# Patient Record
Sex: Female | Born: 1965 | Hispanic: No | Marital: Married | Smoking: Never smoker
Health system: Southern US, Community
[De-identification: ages and names within clinical notes are randomized; demographics above are authoritative.]

## PROBLEM LIST (undated history)

## (undated) DIAGNOSIS — I493 Ventricular premature depolarization: Secondary | ICD-10-CM

## (undated) DIAGNOSIS — Z9889 Other specified postprocedural states: Secondary | ICD-10-CM

## (undated) DIAGNOSIS — J45909 Unspecified asthma, uncomplicated: Secondary | ICD-10-CM

## (undated) DIAGNOSIS — R51 Headache: Secondary | ICD-10-CM

## (undated) DIAGNOSIS — R519 Headache, unspecified: Secondary | ICD-10-CM

## (undated) DIAGNOSIS — F419 Anxiety disorder, unspecified: Secondary | ICD-10-CM

## (undated) DIAGNOSIS — R112 Nausea with vomiting, unspecified: Secondary | ICD-10-CM

## (undated) HISTORY — PX: ABDOMINAL HYSTERECTOMY: SHX81

---

## 2015-12-27 DIAGNOSIS — F418 Other specified anxiety disorders: Secondary | ICD-10-CM | POA: Insufficient documentation

## 2015-12-27 DIAGNOSIS — J45909 Unspecified asthma, uncomplicated: Secondary | ICD-10-CM | POA: Insufficient documentation

## 2016-04-25 ENCOUNTER — Other Ambulatory Visit: Payer: Self-pay | Admitting: Internal Medicine

## 2016-04-25 DIAGNOSIS — Z1231 Encounter for screening mammogram for malignant neoplasm of breast: Secondary | ICD-10-CM

## 2016-05-01 ENCOUNTER — Ambulatory Visit
Admission: RE | Admit: 2016-05-01 | Discharge: 2016-05-01 | Disposition: A | Discharge status: Home / Self Care | Payer: BLUE CROSS/BLUE SHIELD | Source: Ambulatory Visit | Attending: Pediatrics | Admitting: Pediatrics

## 2016-05-01 ENCOUNTER — Other Ambulatory Visit: Payer: Self-pay | Admitting: Pediatrics

## 2016-05-01 DIAGNOSIS — Z1231 Encounter for screening mammogram for malignant neoplasm of breast: Secondary | ICD-10-CM | POA: Diagnosis present

## 2016-05-13 ENCOUNTER — Other Ambulatory Visit: Payer: Self-pay | Admitting: *Deleted

## 2016-05-13 ENCOUNTER — Inpatient Hospital Stay
Admission: RE | Admit: 2016-05-13 | Discharge: 2016-05-13 | Disposition: A | Discharge status: Home / Self Care | Payer: Self-pay | Source: Ambulatory Visit | Attending: *Deleted | Admitting: *Deleted

## 2016-05-13 DIAGNOSIS — Z9289 Personal history of other medical treatment: Secondary | ICD-10-CM

## 2016-09-27 DIAGNOSIS — J302 Other seasonal allergic rhinitis: Secondary | ICD-10-CM | POA: Insufficient documentation

## 2017-04-02 ENCOUNTER — Other Ambulatory Visit: Payer: Self-pay | Admitting: Obstetrics and Gynecology

## 2017-04-02 DIAGNOSIS — N6489 Other specified disorders of breast: Secondary | ICD-10-CM

## 2017-04-10 ENCOUNTER — Ambulatory Visit
Admission: RE | Admit: 2017-04-10 | Discharge: 2017-04-10 | Disposition: A | Discharge status: Home / Self Care | Payer: BLUE CROSS/BLUE SHIELD | Source: Ambulatory Visit | Attending: Obstetrics and Gynecology | Admitting: Obstetrics and Gynecology

## 2017-04-10 DIAGNOSIS — N6489 Other specified disorders of breast: Secondary | ICD-10-CM

## 2017-05-02 ENCOUNTER — Ambulatory Visit (INDEPENDENT_AMBULATORY_CARE_PROVIDER_SITE_OTHER)
Admission: EM | Admit: 2017-05-02 | Discharge: 2017-05-02 | Disposition: A | Discharge status: Other Acute Inpatient Hospital | Payer: BLUE CROSS/BLUE SHIELD | Source: Home / Self Care | Attending: Family Medicine | Admitting: Family Medicine

## 2017-05-02 ENCOUNTER — Emergency Department: Payer: BLUE CROSS/BLUE SHIELD

## 2017-05-02 ENCOUNTER — Emergency Department
Admission: EM | Admit: 2017-05-02 | Discharge: 2017-05-02 | Disposition: A | Discharge status: Home / Self Care | Payer: BLUE CROSS/BLUE SHIELD | Attending: Emergency Medicine | Admitting: Emergency Medicine

## 2017-05-02 ENCOUNTER — Encounter: Payer: Self-pay | Admitting: Emergency Medicine

## 2017-05-02 DIAGNOSIS — I517 Cardiomegaly: Secondary | ICD-10-CM | POA: Insufficient documentation

## 2017-05-02 DIAGNOSIS — Z885 Allergy status to narcotic agent status: Secondary | ICD-10-CM | POA: Insufficient documentation

## 2017-05-02 DIAGNOSIS — Z8249 Family history of ischemic heart disease and other diseases of the circulatory system: Secondary | ICD-10-CM

## 2017-05-02 DIAGNOSIS — F419 Anxiety disorder, unspecified: Secondary | ICD-10-CM | POA: Insufficient documentation

## 2017-05-02 DIAGNOSIS — Z803 Family history of malignant neoplasm of breast: Secondary | ICD-10-CM | POA: Insufficient documentation

## 2017-05-02 DIAGNOSIS — Z79899 Other long term (current) drug therapy: Secondary | ICD-10-CM

## 2017-05-02 DIAGNOSIS — Z823 Family history of stroke: Secondary | ICD-10-CM | POA: Insufficient documentation

## 2017-05-02 DIAGNOSIS — Z87891 Personal history of nicotine dependence: Secondary | ICD-10-CM | POA: Insufficient documentation

## 2017-05-02 DIAGNOSIS — Z888 Allergy status to other drugs, medicaments and biological substances status: Secondary | ICD-10-CM | POA: Insufficient documentation

## 2017-05-02 DIAGNOSIS — R9431 Abnormal electrocardiogram [ECG] [EKG]: Secondary | ICD-10-CM | POA: Insufficient documentation

## 2017-05-02 DIAGNOSIS — R079 Chest pain, unspecified: Secondary | ICD-10-CM

## 2017-05-02 DIAGNOSIS — Z880 Allergy status to penicillin: Secondary | ICD-10-CM

## 2017-05-02 DIAGNOSIS — R002 Palpitations: Secondary | ICD-10-CM | POA: Insufficient documentation

## 2017-05-02 DIAGNOSIS — R200 Anesthesia of skin: Secondary | ICD-10-CM | POA: Insufficient documentation

## 2017-05-02 LAB — CBC
HEMATOCRIT: 41.2 % (ref 35.0–47.0)
HEMOGLOBIN: 14.2 g/dL (ref 12.0–16.0)
MCH: 31.7 pg (ref 26.0–34.0)
MCHC: 34.4 g/dL (ref 32.0–36.0)
MCV: 92.3 fL (ref 80.0–100.0)
Platelets: 203 10*3/uL (ref 150–440)
RBC: 4.47 MIL/uL (ref 3.80–5.20)
RDW: 13.7 % (ref 11.5–14.5)
WBC: 5.5 10*3/uL (ref 3.6–11.0)

## 2017-05-02 LAB — BASIC METABOLIC PANEL
ANION GAP: 9 (ref 5–15)
BUN: 20 mg/dL (ref 6–20)
CO2: 28 mmol/L (ref 22–32)
Calcium: 9.2 mg/dL (ref 8.9–10.3)
Chloride: 104 mmol/L (ref 101–111)
Creatinine, Ser: 0.77 mg/dL (ref 0.44–1.00)
GFR calc Af Amer: >60 mL/min (ref >60)
GFR calc non Af Amer: >60 mL/min (ref >60)
GLUCOSE: 119 mg/dL — AB (ref 65–99)
POTASSIUM: 3.7 mmol/L (ref 3.5–5.1)
Sodium: 141 mmol/L (ref 135–145)

## 2017-05-02 LAB — TROPONIN I: Troponin I: <0.03 ng/mL (ref <0.03)

## 2017-05-02 MED ORDER — ASPIRIN 81 MG PO CHEW
324.0000 mg | CHEWABLE_TABLET | Freq: Once | ORAL | Status: AC
  Administered 2017-05-02: 324 mg via ORAL

## 2017-05-02 MED ORDER — DIAZEPAM 5 MG PO TABS: 5.0000 mg | ORAL_TABLET | Freq: Three times a day (TID) | ORAL | 0 refills | Status: DC | PRN

## 2017-05-02 MED ORDER — DIAZEPAM 5 MG PO TABS
5.0000 mg | ORAL_TABLET | Freq: Once | ORAL | Status: AC
  Administered 2017-05-02: 5 mg via ORAL
  Filled 2017-05-02: qty 1

## 2017-05-02 MED ORDER — ASPIRIN 81 MG PO CHEW: 324.0000 mg | CHEWABLE_TABLET | Freq: Once | ORAL | Status: DC

## 2017-05-02 NOTE — ED Provider Notes (Signed)
[facility] Emergency Department Provider Note       Time seen: ----------------------------------------- 4:27 PM on 05/02/2017 -----------------------------------------     I have reviewed the triage vital signs and the nursing notes.   HISTORY   Chief Complaint Chest Pain    HPI Amber Weaver is a 51 y.o. female who presents to the ED for irregular heartbeat and chest pain. Patient states she has 4 out of 10 chest tightness, nothing makes it better or worse. She denies any recent illness. Patient has had significant stress events in the past but this feels differently.   History reviewed. No pertinent past medical history.  There are no active problems to display for this patient.   History reviewed. No pertinent surgical history.  Allergies Morphine and related; Penicillins; and Vicodin [hydrocodone-acetaminophen]  Social History Social History  Substance Use Topics  . Smoking status: Never Smoker  . Smokeless tobacco: Never Used  . Alcohol use Yes    Review of Systems Constitutional: Negative for fever. Eyes: Negative for vision changes ENT:  Negative for congestion, sore throat Cardiovascular: positive for chest pain, palpitations Respiratory: Negative for shortness of breath. Gastrointestinal: Negative for abdominal pain, vomiting and diarrhea. Genitourinary: Negative for dysuria. Musculoskeletal: Negative for back pain. Skin: Negative for rash. Neurological: Negative for headaches, focal weakness or numbness.  All systems negative/normal/unremarkable except as stated in the HPI  ____________________________________________   PHYSICAL EXAM:  VITAL SIGNS: ED Triage Vitals  Enc Vitals Group     BP 05/02/17 1443 138/80     Pulse Rate 05/02/17 1443 93     Resp 05/02/17 1443 16     Temp --      Temp src --      SpO2 05/02/17 1443 98 %     Weight 05/02/17 1443 150 lb (68 kg)     Height 05/02/17 1443 5\' 6"  (1.676 m)   Head Circumference --      Peak Flow --      Pain Score 05/02/17 1625 4     Pain Loc --      Pain Edu? --      Excl. in [place]? --     Constitutional: anxious, no distress Eyes: Conjunctivae are normal. Normal extraocular movements. ENT   Head: Normocephalic and atraumatic.   Nose: No congestion/rhinnorhea.   Mouth/Throat: Mucous membranes are moist.   Neck: No stridor. Cardiovascular: Normal rate, regular rhythm. No murmurs, rubs, or gallops. Respiratory: Normal respiratory effort without tachypnea nor retractions. Breath sounds are clear and equal bilaterally. No wheezes/rales/rhonchi. Gastrointestinal: Soft and nontender. Normal bowel sounds Musculoskeletal: Nontender with normal range of motion in extremities. No lower extremity tenderness nor edema. Neurologic:  Normal speech and language. No gross focal neurologic deficits are appreciated.  Skin:  Skin is warm, dry and intact. No rash noted. Psychiatric: Mood and affect are normal. Speech and behavior are normal.  ____________________________________________  EKG: Interpreted by me.sinus rhythm with occasional PVCs, normal PR interval, normal QRS, normal QT.  ____________________________________________  ED COURSE:  Pertinent labs & imaging results that were available during my care of the patient were reviewed by me and considered in my medical decision making (see chart for details). Patient presents for chest pain, we will assess with labs and imaging as indicated.   Procedures ____________________________________________   LABS (pertinent positives/negatives)  Labs Reviewed  BASIC METABOLIC PANEL - Abnormal; Notable for the following:       Result Value   Glucose, Bld 119 (*)  All other components within normal limits  CBC  TROPONIN I    RADIOLOGY chest x-ray  IMPRESSION: Hyperinflation without evidence of active disease.   ____________________________________________  FINAL ASSESSMENT AND  PLAN  chest pain, palpitations, anxiety   Plan: Patient's labs and imaging were dictated above. Patient had presented for chest pain likely associated with anxiety and palpitations. Her workup here is been negative. She did have occasional PVCs on her EKG but no other concerning findings. She is stable for outpatient follow-up.   Earleen Newport, MD   Note: This note was generated in part or whole with voice recognition software. Voice recognition is usually quite accurate but there are transcription errors that can and very often do occur. I apologize for any typographical errors that were not detected and corrected.     Earleen Newport, MD 05/02/17 [phone]

## 2017-05-02 NOTE — ED Notes (Signed)
Pt sent over from [facility] for further eval of chest pain. Pt had abnormal ECG at [place]. Pt was advised to be transported by EMS but she refused and stated that "the ambulance was too slow". This per Dr Alveta Heimlich over at [facility].

## 2017-05-02 NOTE — ED Provider Notes (Signed)
[facility]    CSN: [id number] Arrival date & time: 05/02/17  1323     History   Chief Complaint Chief Complaint  Patient presents with  . Chest Pain    HPI Amber Weaver is a 51 y.o. female.   Patient is a 51 year old white female who presents with chest tightness. She states she woke up about 8:00 this morning with chest tightness. She reports chest tightness radiating was also characterized by tingling down both shoulders and numbness down both shoulders. As the discomfort continued during the day she took some Gaviscon without relief. Later on his day went on in the chest tightness and discomfort continued she took some Aleve but one didn't improve and in our she came here to be seen and evaluated. She states she's had a history of anxiety before she is also was entering menopause and she admits those things can be a factor in her distress. However she states that she also listened to her heart with a stethoscope and can hear palpitations and arrhythmias occurring and so she became even more anxious and nervous. Past smoker history does not other medical problems or medical history this pertinent. Her father had multiple strokes after he was 55 died of liver cancer and mother died of consultations and dementia. No early history the family of sudden death. She does not smoke. No previous surgeries operations no chronic medications.   The history is provided by the patient. No language interpreter was used.  Chest Pain  Pain location:  Substernal area and L chest Pain quality: aching and pressure   Pain radiates to:  L shoulder and R shoulder Pain severity:  Moderate Onset quality:  Sudden Duration:  6 hours Timing:  Constant Progression:  Worsening Chronicity:  New Context: at rest   Relieved by:  Nothing Worsened by:  Nothing Ineffective treatments:  Antacids (antiindflamatories) Associated symptoms: dizziness and palpitations   Associated symptoms: no shortness  of breath   Risk factors: no high cholesterol, no hypertension, not female, not pregnant, no prior DVT/PE and no smoking     History reviewed. No pertinent past medical history.  There are no active problems to display for this patient.   History reviewed. No pertinent surgical history.  OB History    No data available       Home Medications    Prior to Admission medications   Medication Sig Start Date End Date Taking? Authorizing Provider  magnesium oxide (MAG-OX) 400 MG tablet Take 400 mg by mouth daily.   Yes [provider]    Family History Family History  Problem Relation Age of Onset  . Hypertension Mother   . Stroke Father   . Breast cancer Neg Hx     Social History Social History  Substance Use Topics  . Smoking status: Never Smoker  . Smokeless tobacco: Never Used  . Alcohol use Yes     Allergies   Morphine and related; Penicillins; and Vicodin [hydrocodone-acetaminophen]   Review of Systems Review of Systems  Respiratory: Negative for shortness of breath.   Cardiovascular: Positive for chest pain and palpitations.  Neurological: Positive for dizziness.  All other systems reviewed and are negative.    Physical Exam Triage Vital Signs ED Triage Vitals  Enc Vitals Group     BP 05/02/17 1328 (!) 140/96     Pulse Rate 05/02/17 1328 93     Resp 05/02/17 1328 14     Temp 05/02/17 1328 98.7 F (37.1 C)  Temp Source 05/02/17 1328 Oral     SpO2 05/02/17 1328 99 %     Weight 05/02/17 1326 150 lb (68 kg)     Height 05/02/17 1326 5\' 6"  (1.676 m)     Head Circumference --      Peak Flow --      Pain Score 05/02/17 1326 4     Pain Loc --      Pain Edu? --      Excl. in [place]? --    No data found.   Updated Vital Signs BP (!) 140/96 (BP Location: Left Arm)   Pulse 93   Temp 98.7 F (37.1 C) (Oral)   Resp 14   Ht 5\' 6"  (1.676 m)   Wt 150 lb (68 kg)   SpO2 99%   BMI 24.21 kg/m   Visual Acuity Right Eye Distance:   Left Eye  Distance:   Bilateral Distance:    Right Eye Near:   Left Eye Near:    Bilateral Near:     Physical Exam  Constitutional: She is oriented to person, place, and time. She appears well-developed and well-nourished. She appears distressed.  HENT:  Head: Normocephalic and atraumatic.  Right Ear: External ear normal.  Left Ear: External ear normal.  Eyes: Pupils are equal, round, and reactive to light. EOM are normal.  Neck: Normal range of motion. Neck supple.  Cardiovascular: S1 normal, S2 normal, normal heart sounds and normal pulses.  An irregular rhythm present.  Pulmonary/Chest: Effort normal and breath sounds normal. No respiratory distress.  Abdominal: Soft. Bowel sounds are normal. She exhibits no distension.  Musculoskeletal: Normal range of motion. She exhibits no edema or tenderness.  Neurological: She is alert and oriented to person, place, and time.  Skin: Skin is warm.  Psychiatric: She has a normal mood and affect. Her behavior is normal.  Vitals reviewed.    UC Treatments / Results  Labs (all labs ordered are listed, but only abnormal results are displayed) Labs Reviewed - No data to display  EKG  EKG Interpretation None      ED ECG REPORT I, Keyira Mondesir H, the attending physician, personally viewed and interpreted this ECG.   Date: 05/02/2017  EKG Time:13:33:23  Rate:92  Rhythm: there are no previous tracings available for comparison, normal sinus rhythm, frequent PVC's noted  Axis: 70  Intervals:none  ST&T Change: Nonspecific ST changes Radiology No results found.  Procedures Procedures (including critical care time)  Medications Ordered in UC Medications  aspirin chewable tablet 324 mg (324 mg Oral Given 05/02/17 1354)     Initial Impression / Assessment and Plan / UC Course  I have reviewed the triage vital signs and the nursing notes.  Pertinent labs & imaging results that were available during my care of the patient were reviewed by me  and considered in my medical decision making (see chart for details).    EKG was abnormal with PVCs and this some nonspecific ST changes present as well. PVCs were frequent. Because of her abnormal EKG age recommend they go to the ED of her choice they have elected to go to [facility] regional ED. Initially with her husband present in the room they've elected to go by and last to [facility] ED but after 4 baby aspirins were ordered and given as well as O2 they have not decided they want to go by POV and we acquiesced to their request.   Final Clinical Impressions(s) / UC Diagnoses   Final  diagnoses:  Chest pain, unspecified type  Anxiety  Palpitations    New Prescriptions Discharge Medication List as of 05/02/2017  2:03 PM     Note: This dictation was prepared with Dragon dictation along with smaller phrase technology. Any transcriptional errors that result from this process are unintentional.  Controlled Substance Prescriptions [place] Controlled Substance Registry consulted?Marland Kitchen Not Applicable   Frederich Cha, MD 05/02/17 [phone]

## 2017-05-02 NOTE — ED Triage Notes (Signed)
Patient c/o chest tightness that started at 8am this morning.  Patient denies SOB.

## 2017-06-03 DIAGNOSIS — R002 Palpitations: Secondary | ICD-10-CM | POA: Insufficient documentation

## 2017-06-24 DIAGNOSIS — I493 Ventricular premature depolarization: Secondary | ICD-10-CM | POA: Insufficient documentation

## 2017-10-24 DIAGNOSIS — Z9889 Other specified postprocedural states: Secondary | ICD-10-CM

## 2017-10-24 DIAGNOSIS — R112 Nausea with vomiting, unspecified: Secondary | ICD-10-CM

## 2017-10-24 HISTORY — DX: Nausea with vomiting, unspecified: R11.2

## 2017-10-24 HISTORY — DX: Other specified postprocedural states: Z98.890

## 2017-10-30 ENCOUNTER — Encounter
Admission: RE | Admit: 2017-10-30 | Discharge: 2017-10-30 | Disposition: A | Discharge status: Home / Self Care | Payer: BLUE CROSS/BLUE SHIELD | Source: Ambulatory Visit | Attending: Obstetrics and Gynecology | Admitting: Obstetrics and Gynecology

## 2017-10-30 ENCOUNTER — Other Ambulatory Visit: Payer: Self-pay

## 2017-10-30 ENCOUNTER — Encounter: Payer: Self-pay | Admitting: *Deleted

## 2017-10-30 HISTORY — DX: Headache, unspecified: R51.9

## 2017-10-30 HISTORY — DX: Unspecified asthma, uncomplicated: J45.909

## 2017-10-30 HISTORY — DX: Ventricular premature depolarization: I49.3

## 2017-10-30 HISTORY — DX: Anxiety disorder, unspecified: F41.9

## 2017-10-30 HISTORY — DX: Headache: R51

## 2017-10-30 NOTE — Patient Instructions (Signed)
Your procedure is scheduled on: November 07, 2017 Friday  Report to Day Surgery on the 2nd floor of the [facility]. To find out your arrival time, please call [phone] between 1PM - 3PM on: November 06, 2017 Thursday   REMEMBER: Instructions that are not followed completely may result in serious medical risk, up to and including death; or upon the discretion of your surgeon and anesthesiologist your surgery may need to be rescheduled.  Do not eat food after midnight the night before your procedure.  No gum chewing, lozengers or hard candies.  You may however, drink CLEAR liquids up to 2 hours before you are scheduled to arrive for your surgery. Do not drink anything within 2 hours of the start of your surgery.  Clear liquids include: - water  - apple juice without pulp - clear gatorade - black coffee or tea (Do NOT add anything to the coffee or tea) Do NOT drink anything that is not on this list.    No Alcohol for 24 hours before or after surgery.  No Smoking including e-cigarettes for 24 hours prior to surgery.  No chewable tobacco products for at least 6 hours prior to surgery.  No nicotine patches on the day of surgery.  On the morning of surgery brush your teeth with toothpaste and water, you may rinse your mouth with mouthwash if you wish. Do not swallow any toothpaste or mouthwash.  Notify your doctor if there is any change in your medical condition (cold, fever, infection).  Do not wear jewelry, make-up, hairpins, clips or nail polish.  Do not wear lotions, powders, or perfumes. You may NOT wear deodorant.  Do not shave 48 hours prior to surgery. Men may shave face and neck.  Contacts and dentures may not be worn into surgery.  Do not bring valuables to the hospital, including drivers license, insurance or credit cards.  [facility] is not responsible for any belongings or valuables.   TAKE THESE MEDICATIONS THE MORNING OF SURGERY: NONE  Use inhalers on the day  of surgery and bring to the hospital.  Stop Anti-inflammatories (NSAIDS) such as Advil, Aleve, Ibuprofen, Motrin, Naproxen, Naprosyn and Aspirin based products such as Excedrin, Goodys Powder, BC Powder. (May take Tylenol or Acetaminophen if needed.)  Stop ANY OVER THE COUNTER supplements until after surgery BLACK COHOSH . (May continue CALCIUM, Vitamin D, Vitamin B, and multivitamin.)  Wear comfortable clothing (specific to your surgery type) to the hospital.  Plan for stool softeners for home use.  If you are being admitted to the hospital overnight, leave your suitcase in the car. After surgery it may be brought to your room.  If you are being discharged the day of surgery, you will not be allowed to drive home. You will need a responsible adult to drive you home and stay with you that night.   If you are taking public transportation, you will need to have a responsible adult with you. Please confirm with your physician that it is acceptable to use public transportation.   Please call [phone] if you have any questions about these instructions.

## 2017-10-30 NOTE — H&P (Signed)
Chief Complaint:     [name] is a 52 y.o. female here for Pre Op Consulting (sign consents) .  HPI:  Pt presents for a preoperative visit to schedule a D&C, hysteroscopy, Ultrasoundfor hx of PMB 8/18: TVUS-0 5.5  mm stripe and EMBx neg, but disordered proliferative endometrium.  Repeat u.s with 90mm ES, complex  She has a hx of: PMB  Past Medical History:  has a past medical history of Allergic rhinitis due to dogs, Anxiety about health (12/27/2015), Asthma, mild intermittent, well-controlled (12/27/2015), Dysfunctional uterine bleeding, Migraine without aura, and PVC (premature ventricular contraction).  Past Surgical History:  has a past surgical history that includes trigger finger release- not successful. Family History: family history includes Alcohol abuse in her brother; Alzheimer's disease in her mother; Cataracts in her mother; Coronary Artery Disease (Blocked arteries around heart) in her mother; Diabetes type II in her father; Gout in her father; High blood pressure (Hypertension) in her mother; Liver cancer in her father; No Known Problems in her sister; Osteoporosis (Thinning of bones) in her mother; Stroke (age of onset: 58) in her father. Social History:  reports that she has never smoked. She has never used smokeless tobacco. She reports that she drinks alcohol. OB/GYN History:  OB History    Gravida  4   Para  3   Term  3   Preterm      AB  1   Living  3     SAB  1   TAB      Ectopic      Molar      Multiple      Live Births             Allergies: is allergic to morphine; penicillin; and vicodin [hydrocodone-acetaminophen]. Medications:  Current Outpatient Medications:  .  acetaminophen-codeine (TYLENOL #3) 300-30 mg tablet, Take 1 tablet by mouth 3 (three) times daily as needed for Pain., Disp: , Rfl:  .  albuterol 90 mcg/actuation inhaler, Inhale into the lungs., Disp: , Rfl:  .  CALCIUM ORAL, Take 500 mg by mouth 2 (two) times daily  , Disp: ,  Rfl:  .  CYANOCOBALAMIN, VITAMIN B-12, (VITAMIN B-12 ORAL), Take by mouth once daily. Reported on 12/27/2015, Disp: , Rfl:  .  cyclobenzaprine (FLEXERIL) 5 MG tablet, Take 1 tablet (5 mg total) by mouth 3 (three) times daily as needed for Muscle spasms., Disp: 30 tablet, Rfl: 0 .  diazePAM (VALIUM) 5 MG tablet, Take by mouth., Disp: , Rfl:  .  fluticasone-salmeterol (ADVAIR DISKUS) 250-50 mcg/dose diskus inhaler, , Disp: , Rfl:  .  loratadine (CLARITIN) 10 mg capsule, Take 10 mg by mouth once daily as needed., Disp: , Rfl:  .  magnesium oxide 500 mg Tab, Take by mouth., Disp: , Rfl:  .  naproxen sodium (ALEVE, ANAPROX) 220 MG tablet, Take 220 mg by mouth 2 (two) times daily with meals., Disp: , Rfl:   Review of Systems: No SOB, no palpitations or chest pain, no new lower extremity edema, no nausea or vomiting or bowel or bladder complaints. See HPI for gyn specific ROS.   Exam:   Vitals:   10/30/17 1055  BP: 116/70    WDWN   female in NAD Body mass index is 23.89 kg/m.  General: Patient is well-groomed, well-nourished, appears stated age in no acute distress  HEENT: head is atraumatic and normocephalic, trachea is midline, neck is supple with no palpable nodules  CV: Regular rhythm and normal  heart rate, no murmur  Pulm: Clear to auscultation throughout lung fields with no wheezing, crackles, or rhonchi. No increased work of breathing  Abdomen: soft , no mass, non-tender, no rebound tenderness, no hepatomegaly  Pelvic: Deferred  Impression:   The primary encounter diagnosis was Post-menopausal bleeding. A diagnosis of Endometrial stripe increased was also pertinent to this visit.    Plan:   -  Preoperative visit: D&C hysteroscopy. Consents signed today. Risks of surgery were discussed with the patient including but not limited to: bleeding which may require transfusion; infection which may require antibiotics; injury to uterus or surrounding organs; intrauterine scarring  which may impair future fertility; need for additional procedures including laparotomy or laparoscopy; and other postoperative/anesthesia complications. Written informed consent was obtained.  This is a scheduled same-day surgery. She will have a postop visit in 2 weeks to review operative findings and pathology.  Return for Postop check.  Sherrie George, MD

## 2017-11-07 ENCOUNTER — Encounter: Payer: Self-pay | Admitting: *Deleted

## 2017-11-07 ENCOUNTER — Ambulatory Visit: Payer: BLUE CROSS/BLUE SHIELD | Admitting: Anesthesiology

## 2017-11-07 ENCOUNTER — Ambulatory Visit
Admission: RE | Admit: 2017-11-07 | Discharge: 2017-11-07 | Disposition: A | Discharge status: Home / Self Care | Payer: BLUE CROSS/BLUE SHIELD | Source: Ambulatory Visit | Attending: Obstetrics and Gynecology | Admitting: Obstetrics and Gynecology

## 2017-11-07 ENCOUNTER — Encounter
Admission: RE | Disposition: A | Discharge status: Home / Self Care | Payer: Self-pay | Source: Ambulatory Visit | Attending: Obstetrics and Gynecology

## 2017-11-07 DIAGNOSIS — Z79899 Other long term (current) drug therapy: Secondary | ICD-10-CM | POA: Diagnosis not present

## 2017-11-07 DIAGNOSIS — N950 Postmenopausal bleeding: Secondary | ICD-10-CM | POA: Insufficient documentation

## 2017-11-07 DIAGNOSIS — Z7951 Long term (current) use of inhaled steroids: Secondary | ICD-10-CM | POA: Insufficient documentation

## 2017-11-07 DIAGNOSIS — J4520 Mild intermittent asthma, uncomplicated: Secondary | ICD-10-CM | POA: Insufficient documentation

## 2017-11-07 DIAGNOSIS — Z791 Long term (current) use of non-steroidal anti-inflammatories (NSAID): Secondary | ICD-10-CM | POA: Insufficient documentation

## 2017-11-07 DIAGNOSIS — F419 Anxiety disorder, unspecified: Secondary | ICD-10-CM | POA: Insufficient documentation

## 2017-11-07 HISTORY — PX: HYSTEROSCOPY WITH D & C: SHX1775

## 2017-11-07 LAB — URINALYSIS, COMPLETE (UACMP) WITH MICROSCOPIC
Bacteria, UA: NONE SEEN
Bilirubin Urine: NEGATIVE
GLUCOSE, UA: NEGATIVE mg/dL
Ketones, ur: 5 mg/dL — AB
Leukocytes, UA: NEGATIVE
NITRITE: NEGATIVE
PH: 7 (ref 5.0–8.0)
Protein, ur: NEGATIVE mg/dL
Specific Gravity, Urine: 1.004 — ABNORMAL LOW (ref 1.005–1.030)
Squamous Epithelial / LPF: NONE SEEN

## 2017-11-07 SURGERY — DILATATION AND CURETTAGE /HYSTEROSCOPY
Anesthesia: General

## 2017-11-07 MED ORDER — FAMOTIDINE 20 MG PO TABS
20.0000 mg | ORAL_TABLET | Freq: Once | ORAL | Status: AC
  Administered 2017-11-07: 20 mg via ORAL

## 2017-11-07 MED ORDER — ONDANSETRON HCL 4 MG/2ML IJ SOLN
INTRAMUSCULAR | Status: AC
  Filled 2017-11-07: qty 2

## 2017-11-07 MED ORDER — MEPERIDINE HCL 50 MG/ML IJ SOLN: 6.2500 mg | INTRAMUSCULAR | Status: DC | PRN

## 2017-11-07 MED ORDER — ACETAMINOPHEN 10 MG/ML IV SOLN
INTRAVENOUS | Status: AC
  Filled 2017-11-07: qty 100

## 2017-11-07 MED ORDER — PROMETHAZINE HCL 25 MG/ML IJ SOLN: 6.2500 mg | INTRAMUSCULAR | Status: DC | PRN

## 2017-11-07 MED ORDER — LACTATED RINGERS IV SOLN
INTRAVENOUS | Status: DC | PRN
  Administered 2017-11-07: 09:00:00 via INTRAVENOUS

## 2017-11-07 MED ORDER — LACTATED RINGERS IV SOLN
INTRAVENOUS | Status: DC
  Administered 2017-11-07: 08:00:00 via INTRAVENOUS

## 2017-11-07 MED ORDER — LACTATED RINGERS IV SOLN: INTRAVENOUS | Status: DC | PRN

## 2017-11-07 MED ORDER — DEXAMETHASONE SODIUM PHOSPHATE 10 MG/ML IJ SOLN
INTRAMUSCULAR | Status: DC | PRN
  Administered 2017-11-07: 10 mg via INTRAVENOUS

## 2017-11-07 MED ORDER — ACETAMINOPHEN 10 MG/ML IV SOLN
INTRAVENOUS | Status: DC | PRN
  Administered 2017-11-07: 1000 mg via INTRAVENOUS

## 2017-11-07 MED ORDER — LACTATED RINGERS IV SOLN: INTRAVENOUS | Status: DC

## 2017-11-07 MED ORDER — HYDROMORPHONE HCL 1 MG/ML IJ SOLN
INTRAMUSCULAR | Status: DC | PRN
  Administered 2017-11-07 (×2): 1 mg via INTRAVENOUS

## 2017-11-07 MED ORDER — LIDOCAINE HCL (CARDIAC) 20 MG/ML IV SOLN
INTRAVENOUS | Status: DC | PRN
  Administered 2017-11-07: 80 mg via INTRAVENOUS

## 2017-11-07 MED ORDER — MIDAZOLAM HCL 2 MG/2ML IJ SOLN
INTRAMUSCULAR | Status: DC | PRN
  Administered 2017-11-07: 4 mg via INTRAVENOUS

## 2017-11-07 MED ORDER — LIDOCAINE HCL (PF) 2 % IJ SOLN
INTRAMUSCULAR | Status: AC
  Filled 2017-11-07: qty 10

## 2017-11-07 MED ORDER — HYDROMORPHONE HCL 1 MG/ML IJ SOLN
INTRAMUSCULAR | Status: AC
  Filled 2017-11-07: qty 1

## 2017-11-07 MED ORDER — ONDANSETRON 4 MG PO TBDP: 4.0000 mg | ORAL_TABLET | Freq: Four times a day (QID) | ORAL | 0 refills | Status: DC | PRN

## 2017-11-07 MED ORDER — GLYCOPYRROLATE 0.2 MG/ML IJ SOLN
INTRAMUSCULAR | Status: AC
  Filled 2017-11-07: qty 1

## 2017-11-07 MED ORDER — PROPOFOL 10 MG/ML IV BOLUS
INTRAVENOUS | Status: DC | PRN
  Administered 2017-11-07: 20 mg via INTRAVENOUS
  Administered 2017-11-07: 30 mg via INTRAVENOUS
  Administered 2017-11-07: 150 mg via INTRAVENOUS

## 2017-11-07 MED ORDER — FENTANYL CITRATE (PF) 100 MCG/2ML IJ SOLN: 25.0000 ug | INTRAMUSCULAR | Status: DC | PRN

## 2017-11-07 MED ORDER — ONDANSETRON HCL 4 MG/2ML IJ SOLN
INTRAMUSCULAR | Status: DC | PRN
  Administered 2017-11-07: 4 mg via INTRAVENOUS

## 2017-11-07 MED ORDER — DEXAMETHASONE SODIUM PHOSPHATE 10 MG/ML IJ SOLN
INTRAMUSCULAR | Status: AC
  Filled 2017-11-07: qty 1

## 2017-11-07 MED ORDER — DOCUSATE SODIUM 100 MG PO CAPS: 100.0000 mg | ORAL_CAPSULE | Freq: Two times a day (BID) | ORAL | 0 refills | Status: DC

## 2017-11-07 MED ORDER — MIDAZOLAM HCL 2 MG/2ML IJ SOLN
INTRAMUSCULAR | Status: AC
  Filled 2017-11-07: qty 4

## 2017-11-07 MED ORDER — PROPOFOL 10 MG/ML IV BOLUS
INTRAVENOUS | Status: AC
  Filled 2017-11-07: qty 20

## 2017-11-07 MED ORDER — IBUPROFEN 800 MG PO TABS: 800.0000 mg | ORAL_TABLET | Freq: Three times a day (TID) | ORAL | 1 refills | Status: DC | PRN

## 2017-11-07 MED ORDER — FAMOTIDINE 20 MG PO TABS
ORAL_TABLET | ORAL | Status: AC
  Filled 2017-11-07: qty 1

## 2017-11-07 MED ORDER — GLYCOPYRROLATE 0.2 MG/ML IJ SOLN
INTRAMUSCULAR | Status: DC | PRN
  Administered 2017-11-07: 0.1 mg via INTRAVENOUS

## 2017-11-07 SURGICAL SUPPLY — 16 items
BAG INFUSER PRESSURE 100CC (MISCELLANEOUS) ×2 IMPLANT
CANISTER SUCT 3000ML PPV (MISCELLANEOUS) ×2 IMPLANT
CATH ROBINSON RED A/P 16FR (CATHETERS) ×2 IMPLANT
ELECT REM PT RETURN 9FT ADLT (ELECTROSURGICAL) ×2
ELECTRODE REM PT RTRN 9FT ADLT (ELECTROSURGICAL) ×1 IMPLANT
GLOVE BIO SURGEON STRL SZ7 (GLOVE) ×6 IMPLANT
GLOVE INDICATOR 7.5 STRL GRN (GLOVE) ×6 IMPLANT
GOWN STRL REUS W/ TWL LRG LVL3 (GOWN DISPOSABLE) ×2 IMPLANT
GOWN STRL REUS W/TWL LRG LVL3 (GOWN DISPOSABLE) ×2
IV LACTATED RINGERS 1000ML (IV SOLUTION) ×2 IMPLANT
KIT TURNOVER CYSTO (KITS) ×2 IMPLANT
PACK DNC HYST (MISCELLANEOUS) ×2 IMPLANT
PAD OB MATERNITY 4.3X12.25 (PERSONAL CARE ITEMS) ×2 IMPLANT
PAD PREP 24X41 OB/GYN DISP (PERSONAL CARE ITEMS) ×2 IMPLANT
TUBING CONNECTING 10 (TUBING) ×2 IMPLANT
TUBING HYSTEROSCOPY DOLPHIN (MISCELLANEOUS) ×4 IMPLANT

## 2017-11-07 NOTE — Anesthesia Post-op Follow-up Note (Signed)
Anesthesia QCDR form completed.        

## 2017-11-07 NOTE — Interval H&P Note (Signed)
History and Physical Interval Note:  11/07/2017 9:26 AM  Amber Weaver  has presented today for surgery, with the diagnosis of Thickened endometrial stripe  The various methods of treatment have been discussed with the patient and family. After consideration of risks, benefits and other options for treatment, the patient has consented to  Procedure(s): DILATATION AND CURETTAGE /HYSTEROSCOPY (N/A) as a surgical intervention .  The patient's history has been reviewed, patient examined, no change in status, stable for surgery.  I have reviewed the patient's chart and labs.  Questions were answered to the patient's satisfaction.     Benjaman Kindler

## 2017-11-07 NOTE — Anesthesia Preprocedure Evaluation (Addendum)
Anesthesia Evaluation  Patient identified by MRN, date of birth, ID band Patient awake    Reviewed: Allergy & Precautions, H&P , NPO status , Patient's Chart, lab work & pertinent test results  History of Anesthesia Complications Negative for: history of anesthetic complications  Airway Mallampati: II  TM Distance: >3 FB Neck ROM: Full    Dental no notable dental hx.    Pulmonary asthma , neg sleep apnea, neg COPD,    breath sounds clear to auscultation- rhonchi (-) wheezing      Cardiovascular Exercise Tolerance: Good (-) angina(-) CAD, (-) Past MI, (-) Cardiac Stents and (-) DOE negative cardio ROS   Rhythm:Regular Rate:Normal - Systolic murmurs and - Diastolic murmurs    Neuro/Psych  Headaches, PSYCHIATRIC DISORDERS Anxiety    GI/Hepatic negative GI ROS, Neg liver ROS, neg GERD  ,  Endo/Other  negative endocrine ROSneg diabetes  Renal/GU negative Renal ROS     Musculoskeletal   Abdominal (+) - obese,   Peds  Hematology negative hematology ROS (+)   Anesthesia Other Findings Past Medical History: No date: Anxiety No date: Asthma No date: Headache     Comment:  migraines No date: PVC (premature ventricular contraction)  History reviewed. No pertinent surgical history.     Reproductive/Obstetrics negative OB ROS                           Anesthesia Physical Anesthesia Plan  ASA: II  Anesthesia Plan: General   Post-op Pain Management:    Induction: Intravenous  PONV Risk Score and Plan: 2 and Ondansetron, Dexamethasone and Midazolam  Airway Management Planned: LMA  Additional Equipment:   Intra-op Plan:   Post-operative Plan: Extubation in OR  Informed Consent: I have reviewed the patients History and Physical, chart, labs and discussed the procedure including the risks, benefits and alternatives for the proposed anesthesia with the patient or authorized representative  who has indicated his/her understanding and acceptance.   Dental Advisory Given  Plan Discussed with: Anesthesiologist, CRNA and Surgeon  Anesthesia Plan Comments:        Anesthesia Quick Evaluation

## 2017-11-07 NOTE — Anesthesia Postprocedure Evaluation (Signed)
Anesthesia Post Note  Patient: GLENETTA KIGER  Procedure(s) Performed: DILATATION AND CURETTAGE /HYSTEROSCOPY (N/A )  Patient location during evaluation: PACU Anesthesia Type: General Level of consciousness: awake and alert and oriented Pain management: pain level controlled Vital Signs Assessment: post-procedure vital signs reviewed and stable Respiratory status: spontaneous breathing, nonlabored ventilation and respiratory function stable Cardiovascular status: blood pressure returned to baseline and stable Postop Assessment: no signs of nausea or vomiting Anesthetic complications: no     Last Vitals:  Vitals:   11/07/17 1145 11/07/17 1223  BP: 120/65 116/65  Pulse: 72 68  Resp: 14 14  Temp: 36.4 C   SpO2: 100% 100%    Last Pain:  Vitals:   11/07/17 1145  TempSrc: Temporal  PainSc: 2                  Khadeem Rockett

## 2017-11-07 NOTE — Op Note (Signed)
Operative Report Hysteroscopy with Dilation and Curettage   Indications: Postmenopausal bleeding    Pre-operative Diagnosis: Thickened endometrial stripe of 87mm Post-operative Diagnosis: same.  Procedure: 1. Exam under anesthesia 2. Fractional D&C 3. Hysteroscopy  Surgeon: Benjaman Kindler, MD  Assistant(s):  None  Anesthesia: General LMA anesthesia  Anesthesiologist: Emmie Niemann, MD Anesthesiologist: Emmie Niemann, MD CRNA: Doreen Salvage, CRNA; MacMang, Clovis Riley, CRNA  Estimated Blood Loss:  Minimal         Intraoperative medications: none         Total IV Fluids: 1054ml  Urine Output: 411ml  Total Fluid Deficit:  n/a          Specimens: Endocervical curettings, endometrial curettings         Complications:  None; patient tolerated the procedure well.         Disposition: PACU - hemodynamically stable.         Condition: stable  Findings: Uterus measuring 8 cm by sound; normal cervix, vagina, perineum. Proliferative endometrial tissue, no polyps or irregularities noted. Pictures taken.  Indication for procedure/Consents: 52 y.o.   here for scheduled surgery for the aforementioned diagnoses.   Risks of surgery were discussed with the patient including but not limited to: bleeding which may require transfusion; infection which may require antibiotics; injury to uterus or surrounding organs; intrauterine scarring which may impair future fertility; need for additional procedures including laparotomy or laparoscopy; and other postoperative/anesthesia complications. Written informed consent was obtained.    Procedure Details:  Fractional D&C only  The patient was taken to the operating room where anesthesia was administered and was found to be adequate. After a formal and adequate timeout was performed, she was placed in the dorsal lithotomy position and examined with the above findings. She was then prepped and draped in the sterile manner. Her bladder was  catheterized for an estimated amount of clear, yellow urine. A weighed speculum was then placed in the patient's vagina and a single tooth tenaculum was applied to the anterior lip of the cervix.  Her cervix was serially dilated to 15 [place] using Hanks dilators. An ECC was performed. The hysteroscope was introduced to reveal the above findings. A sharp curettage was then performed until there was a gritty texture in all four quadrants. The tenaculum was removed from the anterior lip of the cervix and the vaginal speculum was removed after applying silver nitrate for good hemostasis.   The patient tolerated the procedure well and was taken to the recovery area awake and in stable condition. She received iv acetaminophen and Toradol prior to leaving the OR.  The patient will be discharged to home as per PACU criteria. Routine postoperative instructions given. She was prescribed Ibuprofen and Colace. She will follow up in the clinic in two weeks for postoperative evaluation.

## 2017-11-07 NOTE — OR Nursing (Signed)
Dr. Leafy Ro in to see pt [id number]

## 2017-11-07 NOTE — Anesthesia Procedure Notes (Signed)
Procedure Name: LMA Insertion Date/Time: 11/07/2017 9:55 AM Performed by: Doreen Salvage, CRNA Pre-anesthesia Checklist: Patient identified, Patient being monitored, Timeout performed, Emergency Drugs available and Suction available Patient Re-evaluated:Patient Re-evaluated prior to induction Oxygen Delivery Method: Circle system utilized Preoxygenation: Pre-oxygenation with 100% oxygen Induction Type: IV induction Ventilation: Mask ventilation without difficulty LMA: LMA inserted LMA Size: 4.0 Tube type: Oral Number of attempts: 1 Placement Confirmation: positive ETCO2 and breath sounds checked- equal and bilateral Tube secured with: Tape Dental Injury: Teeth and Oropharynx as per pre-operative assessment

## 2017-11-07 NOTE — Transfer of Care (Signed)
Immediate Anesthesia Transfer of Care Note  Patient: SAHIAN KERNEY  Procedure(s) Performed: DILATATION AND CURETTAGE /HYSTEROSCOPY (N/A )  Patient Location: PACU  Anesthesia Type:General  Level of Consciousness: awake, alert , oriented and patient cooperative  Airway & Oxygen Therapy: Patient Spontanous Breathing and Patient connected to face mask oxygen  Post-op Assessment: Report given to RN, Post -op Vital signs reviewed and stable and Patient moving all extremities X 4  Post vital signs: Reviewed and stable  Last Vitals:  Vitals:   11/07/17 0816 11/07/17 1045  BP: 124/75 106/61  Pulse: 85 73  Resp: 16 11  Temp: 36.8 C 36.4 C  SpO2: 100% 100%    Last Pain:  Vitals:   11/07/17 0816  TempSrc: Oral         Complications: No apparent anesthesia complications

## 2017-11-07 NOTE — Discharge Instructions (Addendum)
Discharge instructions after a hysteroscopy with dilation and curettage  Signs and Symptoms to Report  Call our office at [phone] if you have any of the following:   . Fever over 100.4 degrees or higher . Severe stomach pain not relieved with pain medications . Bright red bleeding that's heavier than a period that does not slow with rest after the first 24 hours . To go the bathroom a lot (frequency), you can't hold your urine (urgency), or it hurts when you empty your bladder (urinate) . Chest pain . Shortness of breath . Pain in the calves of your legs . Severe nausea and vomiting not relieved with anti-nausea medications . Any concerns  What You Can Expect after Surgery . You may see some pink tinged, bloody fluid. This is normal. You may also have cramping for several days.   Activities after Your Discharge Follow these guidelines to help speed your recovery at home: . Don't drive if you are in pain or taking narcotic pain medicine. You may drive when you can safely slam on the brakes, turn the wheel forcefully, and rotate your torso comfortably. This is typically 4-7 days. Practice in a parking lot or side street prior to attempting to drive regularly.  . Ask others to help with household chores for 4 weeks. . Don't do strenuous activities, exercises, or sports like vacuuming, tennis, squash, etc. until your doctor says it is safe to do so. . Walk as you feel able. Rest often since it may take a week or two for your energy level to return to normal.  . You may climb stairs . Avoid constipation:   -Eat fruits, vegetables, and whole grains. Eat small meals as your appetite will take time to return to normal.   -Drink 6 to 8 glasses of water each day unless your doctor has told you to limit your fluids.   -Use a laxative or stool softener as needed if constipation becomes a problem. You may take Miralax, metamucil, Citrucil, Colace, Senekot, FiberCon, etc. If this does not  relieve the constipation, try two tablespoons of Milk Of Magnesia every 8 hours until your bowels move.  . You may shower.  . Do not get in a hot tub, swimming pool, etc. until your doctor agrees. . Do not douche, use tampons, or have sex until your doctor says it is okay, usually about 2 weeks. . Take your pain medicine when you need it. The medicine may not work as well if the pain is bad.  Take the medicines you were taking before surgery. Other medications you might need are pain medications (ibuprofen), medications for constipation (Colace) and nausea medications (Zofran).        AMBULATORY SURGERY  DISCHARGE INSTRUCTIONS   1) The drugs that you were given will stay in your system until tomorrow so for the next 24 hours you should not:  A) Drive an automobile B) Make any legal decisions C) Drink any alcoholic beverage   2) You may resume regular meals tomorrow.  Today it is better to start with liquids and gradually work up to solid foods.  You may eat anything you prefer, but it is better to start with liquids, then soup and crackers, and gradually work up to solid foods.   3) Please notify your doctor immediately if you have any unusual bleeding, trouble breathing, redness and pain at the surgery site, drainage, fever, or pain not relieved by medication.    4) Additional Instructions:          Please contact your physician with any problems or Same Day Surgery at [phone], Monday through Friday 6 am to 4 pm, or [place] [facility] [place] [facility] number at [phone]. 

## 2017-11-10 LAB — SURGICAL PATHOLOGY

## 2018-03-09 ENCOUNTER — Other Ambulatory Visit: Payer: Self-pay | Admitting: Pediatrics

## 2018-03-09 DIAGNOSIS — Z1231 Encounter for screening mammogram for malignant neoplasm of breast: Secondary | ICD-10-CM

## 2018-04-20 ENCOUNTER — Encounter (INDEPENDENT_AMBULATORY_CARE_PROVIDER_SITE_OTHER): Payer: Self-pay

## 2018-04-20 ENCOUNTER — Ambulatory Visit
Admission: RE | Admit: 2018-04-20 | Discharge: 2018-04-20 | Disposition: A | Discharge status: Home / Self Care | Payer: BLUE CROSS/BLUE SHIELD | Source: Ambulatory Visit | Attending: Pediatrics | Admitting: Pediatrics

## 2018-04-20 DIAGNOSIS — Z1231 Encounter for screening mammogram for malignant neoplasm of breast: Secondary | ICD-10-CM | POA: Insufficient documentation

## 2018-07-21 ENCOUNTER — Other Ambulatory Visit: Payer: Self-pay

## 2018-07-21 ENCOUNTER — Encounter
Admission: RE | Admit: 2018-07-21 | Discharge: 2018-07-21 | Disposition: A | Discharge status: Home / Self Care | Payer: BLUE CROSS/BLUE SHIELD | Source: Ambulatory Visit | Attending: Obstetrics and Gynecology | Admitting: Obstetrics and Gynecology

## 2018-07-21 DIAGNOSIS — Z01812 Encounter for preprocedural laboratory examination: Secondary | ICD-10-CM | POA: Insufficient documentation

## 2018-07-21 HISTORY — DX: Other specified postprocedural states: Z98.890

## 2018-07-21 HISTORY — DX: Nausea with vomiting, unspecified: R11.2

## 2018-07-21 LAB — BASIC METABOLIC PANEL
ANION GAP: 7 (ref 5–15)
BUN: 16 mg/dL (ref 6–20)
CALCIUM: 9.5 mg/dL (ref 8.9–10.3)
CHLORIDE: 103 mmol/L (ref 98–111)
CO2: 31 mmol/L (ref 22–32)
Creatinine, Ser: 0.59 mg/dL (ref 0.44–1.00)
GFR calc non Af Amer: >60 mL/min (ref >60)
Glucose, Bld: 87 mg/dL (ref 70–99)
Potassium: 3.7 mmol/L (ref 3.5–5.1)
Sodium: 141 mmol/L (ref 135–145)

## 2018-07-21 LAB — CBC
HEMATOCRIT: 41.7 % (ref 36.0–46.0)
Hemoglobin: 13.4 g/dL (ref 12.0–15.0)
MCH: 30.4 pg (ref 26.0–34.0)
MCHC: 32.1 g/dL (ref 30.0–36.0)
MCV: 94.6 fL (ref 80.0–100.0)
NRBC: 0 % (ref 0.0–0.2)
Platelets: 238 10*3/uL (ref 150–400)
RBC: 4.41 MIL/uL (ref 3.87–5.11)
RDW: 13.5 % (ref 11.5–15.5)
WBC: 5.1 10*3/uL (ref 4.0–10.5)

## 2018-07-21 LAB — TYPE AND SCREEN
ABO/RH(D): O POS
Antibody Screen: NEGATIVE

## 2018-07-21 NOTE — H&P (Signed)
Chief Complaint:    Patient ID: Amber Weaver is a 52 y.o. female presenting with Pre Op Consulting  on 07/21/2018  HPI: Pt with persistent and recurrent postmenopausal bleeding, with a thickened endometrial stripe. We have treated with progesterone without resolution, but embx have been benign.  Ultrasound with 42mm stripe, small fibroid, 8x5x4cm uterus.  PMB 8/18: TVUS-0 5.5  mm stripe and EMBx pending. If normal, no further f/u- neg 10/30- PVCs  Ultrasound today: 8x4x5cm  Body mass index is 24.21 kg/m.   Past Medical History:  has a past medical history of Allergic rhinitis due to dogs, Anxiety about health (12/27/2015), Asthma, mild intermittent, well-controlled (12/27/2015), Dysfunctional uterine bleeding, Migraine without aura, and PVC (premature ventricular contraction).  Past Surgical History:  has a past surgical history that includes trigger finger release- not successful. Family History: family history includes Alcohol abuse in her brother; Alzheimer's disease in her mother; Cataracts in her mother; Coronary Artery Disease (Blocked arteries around heart) in her mother; Diabetes type II in her father; Gout in her father; High blood pressure (Hypertension) in her mother; Liver cancer in her father; No Known Problems in her sister; Osteoporosis (Thinning of bones) in her mother; Stroke (age of onset: 2) in her father. Social History:  reports that she has never smoked. She has never used smokeless tobacco. She reports current alcohol use. She reports that she does not use drugs. OB/GYN History:  OB History    Gravida  4   Para  3   Term  3   Preterm      AB  1   Living  3     SAB  1   TAB      Ectopic      Molar      Multiple      Live Births             Allergies: is allergic to morphine; penicillin; and vicodin [hydrocodone-acetaminophen]. Medications:  Current Outpatient Medications:  .  acetaminophen-codeine (TYLENOL #3) 300-30 mg tablet, Take  1 tablet by mouth 3 (three) times daily as needed for Pain., Disp: , Rfl:  .  albuterol 90 mcg/actuation inhaler, Inhale into the lungs., Disp: , Rfl:  .  CALCIUM ORAL, Take 500 mg by mouth 2 (two) times daily  , Disp: , Rfl:  .  CYANOCOBALAMIN, VITAMIN B-12, (VITAMIN B-12 ORAL), Take by mouth once daily. Reported on 12/27/2015, Disp: , Rfl:  .  cyclobenzaprine (FLEXERIL) 5 MG tablet, Take 1 tablet (5 mg total) by mouth 3 (three) times daily as needed for Muscle spasms., Disp: 30 tablet, Rfl: 0 .  diazePAM (VALIUM) 5 MG tablet, Take by mouth., Disp: , Rfl:  .  fluticasone-salmeterol (ADVAIR DISKUS) 250-50 mcg/dose diskus inhaler, , Disp: , Rfl:  .  loratadine (CLARITIN) 10 mg capsule, Take 10 mg by mouth once daily as needed., Disp: , Rfl:  .  magnesium oxide 500 mg Tab, Take by mouth., Disp: , Rfl:  .  medroxyPROGESTERone (PROVERA) 10 MG tablet, Take 1 tablet (10 mg total) by mouth once daily, Disp: 90 tablet, Rfl: 0 .  naproxen sodium (ALEVE, ANAPROX) 220 MG tablet, Take 220 mg by mouth 2 (two) times daily with meals., Disp: , Rfl:    Review of Systems: No SOB, no palpitations or chest pain, no new lower extremity edema, no nausea or vomiting or bowel or bladder complaints. See HPI for gyn specific ROS.   Exam:     BP 107/74   Ht  167.6 cm (5\' 6" )   Wt 70 kg (154 lb 6.4 oz)   LMP 03/26/2017   BMI 24.92 kg/m   General: Patient is well-groomed, well-nourished, appears stated age in no acute distress   HEENT: head is atraumatic and normocephalic, trachea is midline, neck is supple with no palpable nodules   CV: Regular rhythm and normal heart rate, no murmur   Pulm: Clear to auscultation throughout lung fields with no wheezing, crackles, or rhonchi. No increased work of breathing  Abdomen: soft , no mass, non-tender, no rebound tenderness, no hepatomegaly  Pelvic: tanner stage 5 ,   External genitalia: vulva /labia no lesions  Urethra: no prolapse  Vagina: normal physiologic d/c,  laxity in vaginal walls  Cervix: no lesions, no cervical motion tenderness, good descent  Uterus: normal size shape and contour, non-tender  Adnexa: no mass,  non-tender    Rectovaginal: External wnl  embx attempted without success due to stenotic cervical os.  Impression:   The primary encounter diagnosis was Post-menopausal bleeding. A diagnosis of Intramural leiomyoma of uterus was also pertinent to this visit.    Plan:   Last embx 8/18  Patient returns for a preoperative discussion regarding her plans to proceed with surgical treatment of her persistent postmenopausal bleeding by total laparoscopic hysterectomy with bilateral salpingectomy  procedure.  We will perform a cystoscopy to evaluate the urinary tract after the procedure.   The patient and I discussed the technical aspects of the procedure including the potential for risks and complications.  These include but are not limited to the risk of infection requiring post-operative antibiotics or further procedures.  We talked about the risk of injury to adjacent organs including bladder, bowel, ureter, blood vessels or nerves.  We talked about the need to convert to an open incision.  We talked about the possible need for blood transfusion.  We talked about postop complications such as thromboembolic or cardiopulmonary complications.  All of her questions were answered.  Her preoperative exam was completed and the appropriate consents were signed. She is scheduled to undergo this procedure in the near future.  Specific Peri-operative Considerations:  - Consent: obtained today - Health Maintenance: up to date - Labs: CBC, CMP preoperatively - Studies: EKG, CXR preoperatively - Bowel Preparation: None required - Abx:  Gent/clinda - VTE ppx: SCDs perioperatively - Glucose Protocol: n/a - Beta-blockade: n/a   Return for Postop check.  Sherrie George, MD

## 2018-07-21 NOTE — Patient Instructions (Addendum)
  Your procedure is scheduled on: Friday August 07, 2018 Report to Same Day Surgery 2nd floor [facility] Entrance-take elevator on left to 2nd floor.  Check in with surgery information desk.) To find out your arrival time, call [phone] 1:00-3:00 PM on Thursday August 06, 2018  Remember: Instructions that are not followed completely may result in serious medical risk, up to and including death, or upon the discretion of your surgeon and anesthesiologist your surgery may need to be rescheduled.    __x__ 1. Do not eat food (including mints, candies, chewing gum) after midnight the night before your procedure. You may drink clear liquids up to 2 hours before you are scheduled to arrive at the hospital for your procedure.  Do not drink anything within 2 hours of your scheduled arrival to the hospital.  Approved clear liquids:  --Water or Apple juice without pulp  --Clear carbohydrate beverage such as Gatorade or Powerade  --Black Coffee or Clear Tea (No milk, no creamers, do not add anything to the coffee or tea)    __x__ 2. No Alcohol for 24 hours before or after surgery.   __x__ 3. No Smoking or e-cigarettes for 24 hours before surgery.  Do not use any chewable tobacco products for at least 6 hours before surgery.   __x__ 4. Notify your doctor if there is any change in your medical condition (cold, fever, infections).   __x__ 5. On the morning of surgery brush your teeth with toothpaste and water.  You may rinse your mouth with mouthwash if you wish.  Do not swallow any toothpaste or mouthwash.  Please read over the following fact sheets that you were given:   [facility] Preparing for Surgery and/or MRSA Information    __x__ Use CHG Soap or Sage wipes as directed on instruction sheet   Do not wear jewelry, make-up, hairpins, clips or nail polish on the day of surgery.  Do not wear lotions, powders, deodorant, or perfumes.   Do not shave below the face/neck 48  hours prior to surgery.   Do not bring valuables to the hospital.    [facility] is not responsible for any belongings or valuables.               Contacts, dentures or bridgework may not be worn into surgery.  Leave your suitcase in the car. After surgery it may be brought to your room.  For patients admitted to the hospital, discharge time is determined by your treatment team.  For patients discharged on the day of surgery, you will NOT be permitted to drive yourself home.  You must have a responsible adult with you for 24 hours after surgery.  __x__ Take anti-hypertensive listed below, cardiac, seizure, asthma, anti-reflux and psychiatric medicines. These include:  1. None on the day of surgery  __x__ Use inhalers on the day of surgery and bring them with you to the hospital.  ____ Follow recommendations from Cardiologist, Pulmonologist or PCP regarding stopping Aspirin, Coumadin, Plavix, Eliquis, Effient, Pradaxa, and Pletal.  __x__ At least 7 days prior to surgery (Last dose Thursday July 31, 2018): Stop Anti-inflammatories such as Advil, Ibuprofen, Motrin, Aleve, Naproxen, Naprosyn, BC/Goodies powders or aspirin products. You may continue to take Tylenol and Celebrex.   __x__ TODAY: Stop supplements until after surgery. You may continue to take Vitamin D, Vitamin B, and multivitamin.

## 2018-08-06 MED ORDER — GENTAMICIN SULFATE 40 MG/ML IJ SOLN
INTRAVENOUS | Status: AC
  Administered 2018-08-07: 340 mg via INTRAVENOUS
  Filled 2018-08-06: qty 8.5

## 2018-08-07 ENCOUNTER — Encounter
Admission: RE | Disposition: A | Discharge status: Home / Self Care | Payer: Self-pay | Source: Ambulatory Visit | Attending: Obstetrics and Gynecology

## 2018-08-07 ENCOUNTER — Ambulatory Visit: Payer: BLUE CROSS/BLUE SHIELD | Admitting: Anesthesiology

## 2018-08-07 ENCOUNTER — Other Ambulatory Visit: Payer: Self-pay

## 2018-08-07 ENCOUNTER — Ambulatory Visit
Admission: RE | Admit: 2018-08-07 | Discharge: 2018-08-07 | Disposition: A | Discharge status: Home / Self Care | Payer: BLUE CROSS/BLUE SHIELD | Source: Ambulatory Visit | Attending: Obstetrics and Gynecology | Admitting: Obstetrics and Gynecology

## 2018-08-07 DIAGNOSIS — Z885 Allergy status to narcotic agent status: Secondary | ICD-10-CM | POA: Diagnosis not present

## 2018-08-07 DIAGNOSIS — Z79899 Other long term (current) drug therapy: Secondary | ICD-10-CM | POA: Insufficient documentation

## 2018-08-07 DIAGNOSIS — N800 Endometriosis of uterus: Secondary | ICD-10-CM | POA: Insufficient documentation

## 2018-08-07 DIAGNOSIS — N838 Other noninflammatory disorders of ovary, fallopian tube and broad ligament: Secondary | ICD-10-CM | POA: Diagnosis not present

## 2018-08-07 DIAGNOSIS — Z793 Long term (current) use of hormonal contraceptives: Secondary | ICD-10-CM | POA: Insufficient documentation

## 2018-08-07 DIAGNOSIS — N950 Postmenopausal bleeding: Secondary | ICD-10-CM | POA: Diagnosis not present

## 2018-08-07 DIAGNOSIS — Z880 Allergy status to penicillin: Secondary | ICD-10-CM | POA: Insufficient documentation

## 2018-08-07 DIAGNOSIS — D251 Intramural leiomyoma of uterus: Secondary | ICD-10-CM | POA: Diagnosis not present

## 2018-08-07 HISTORY — PX: CYSTOSCOPY: SHX5120

## 2018-08-07 HISTORY — PX: LAPAROSCOPIC BILATERAL SALPINGECTOMY: SHX5889

## 2018-08-07 HISTORY — PX: LAPAROSCOPIC HYSTERECTOMY: SHX1926

## 2018-08-07 LAB — TYPE AND SCREEN
ABO/RH(D): O POS
Antibody Screen: NEGATIVE

## 2018-08-07 LAB — POCT PREGNANCY, URINE: Preg Test, Ur: NEGATIVE

## 2018-08-07 SURGERY — HYSTERECTOMY, TOTAL, LAPAROSCOPIC
Anesthesia: General

## 2018-08-07 MED ORDER — HYDROMORPHONE HCL 2 MG PO TABS: 2.0000 mg | ORAL_TABLET | ORAL | 0 refills | Status: DC | PRN

## 2018-08-07 MED ORDER — LACTATED RINGERS IV SOLN: INTRAVENOUS | Status: DC

## 2018-08-07 MED ORDER — FAMOTIDINE 20 MG PO TABS
20.0000 mg | ORAL_TABLET | Freq: Once | ORAL | Status: AC
  Administered 2018-08-07: 20 mg via ORAL

## 2018-08-07 MED ORDER — FENTANYL CITRATE (PF) 100 MCG/2ML IJ SOLN: 25.0000 ug | INTRAMUSCULAR | Status: DC | PRN

## 2018-08-07 MED ORDER — DEXAMETHASONE SODIUM PHOSPHATE 10 MG/ML IJ SOLN
INTRAMUSCULAR | Status: DC | PRN
  Administered 2018-08-07: 10 mg via INTRAVENOUS

## 2018-08-07 MED ORDER — GABAPENTIN 300 MG PO CAPS
900.0000 mg | ORAL_CAPSULE | ORAL | Status: AC
  Administered 2018-08-07: 900 mg via ORAL

## 2018-08-07 MED ORDER — BUPIVACAINE HCL (PF) 0.5 % IJ SOLN
INTRAMUSCULAR | Status: AC
  Filled 2018-08-07: qty 30

## 2018-08-07 MED ORDER — CLINDAMYCIN PHOSPHATE 900 MG/50ML IV SOLN
INTRAVENOUS | Status: AC
  Filled 2018-08-07: qty 50

## 2018-08-07 MED ORDER — FENTANYL CITRATE (PF) 100 MCG/2ML IJ SOLN
INTRAMUSCULAR | Status: AC
  Filled 2018-08-07: qty 2

## 2018-08-07 MED ORDER — ONDANSETRON HCL 4 MG/2ML IJ SOLN
4.0000 mg | Freq: Once | INTRAMUSCULAR | Status: AC | PRN
  Administered 2018-08-07: 4 mg via INTRAVENOUS

## 2018-08-07 MED ORDER — BUPIVACAINE HCL 0.5 % IJ SOLN
INTRAMUSCULAR | Status: DC | PRN
  Administered 2018-08-07: 10 mL

## 2018-08-07 MED ORDER — SUGAMMADEX SODIUM 200 MG/2ML IV SOLN
INTRAVENOUS | Status: DC | PRN
  Administered 2018-08-07: 263.2 mg via INTRAVENOUS

## 2018-08-07 MED ORDER — FLUORESCEIN SODIUM 10 % IV SOLN
INTRAVENOUS | Status: AC
  Filled 2018-08-07: qty 5

## 2018-08-07 MED ORDER — PHENYLEPHRINE HCL 10 MG/ML IJ SOLN
INTRAMUSCULAR | Status: DC | PRN
  Administered 2018-08-07 (×2): 100 ug via INTRAVENOUS

## 2018-08-07 MED ORDER — GABAPENTIN 300 MG PO CAPS
ORAL_CAPSULE | ORAL | Status: AC
  Administered 2018-08-07: 900 mg via ORAL
  Filled 2018-08-07: qty 3

## 2018-08-07 MED ORDER — MIDAZOLAM HCL 2 MG/2ML IJ SOLN
INTRAMUSCULAR | Status: DC | PRN
  Administered 2018-08-07: 2 mg via INTRAVENOUS

## 2018-08-07 MED ORDER — LACTATED RINGERS IV SOLN
INTRAVENOUS | Status: DC | PRN
  Administered 2018-08-07: 10:00:00 via INTRAVENOUS

## 2018-08-07 MED ORDER — FAMOTIDINE 20 MG PO TABS
ORAL_TABLET | ORAL | Status: AC
  Administered 2018-08-07: 20 mg via ORAL
  Filled 2018-08-07: qty 1

## 2018-08-07 MED ORDER — GABAPENTIN 800 MG PO TABS: 800.0000 mg | ORAL_TABLET | Freq: Every day | ORAL | 0 refills | Status: DC

## 2018-08-07 MED ORDER — FENTANYL CITRATE (PF) 100 MCG/2ML IJ SOLN
INTRAMUSCULAR | Status: DC | PRN
  Administered 2018-08-07: 50 ug via INTRAVENOUS

## 2018-08-07 MED ORDER — IBUPROFEN 800 MG PO TABS: 800.0000 mg | ORAL_TABLET | Freq: Three times a day (TID) | ORAL | 1 refills | Status: DC | PRN

## 2018-08-07 MED ORDER — CELECOXIB 200 MG PO CAPS
400.0000 mg | ORAL_CAPSULE | ORAL | Status: AC
  Administered 2018-08-07: 400 mg via ORAL

## 2018-08-07 MED ORDER — ROCURONIUM BROMIDE 100 MG/10ML IV SOLN
INTRAVENOUS | Status: DC | PRN
  Administered 2018-08-07: 10 mg via INTRAVENOUS
  Administered 2018-08-07: 20 mg via INTRAVENOUS
  Administered 2018-08-07: 40 mg via INTRAVENOUS

## 2018-08-07 MED ORDER — DOCUSATE SODIUM 100 MG PO CAPS: 100.0000 mg | ORAL_CAPSULE | Freq: Two times a day (BID) | ORAL | 0 refills | Status: DC

## 2018-08-07 MED ORDER — ACETAMINOPHEN 500 MG PO TABS
1000.0000 mg | ORAL_TABLET | ORAL | Status: AC
  Administered 2018-08-07: 1000 mg via ORAL

## 2018-08-07 MED ORDER — EPHEDRINE SULFATE 50 MG/ML IJ SOLN
INTRAMUSCULAR | Status: DC | PRN
  Administered 2018-08-07: 10 mg via INTRAVENOUS

## 2018-08-07 MED ORDER — ACETAMINOPHEN 500 MG PO TABS: 1000.0000 mg | ORAL_TABLET | Freq: Four times a day (QID) | ORAL | 0 refills | Status: AC

## 2018-08-07 MED ORDER — ONDANSETRON HCL 4 MG/2ML IJ SOLN
INTRAMUSCULAR | Status: DC | PRN
  Administered 2018-08-07: 4 mg via INTRAVENOUS

## 2018-08-07 MED ORDER — MIDAZOLAM HCL 2 MG/2ML IJ SOLN
INTRAMUSCULAR | Status: AC
  Filled 2018-08-07: qty 2

## 2018-08-07 MED ORDER — CELECOXIB 200 MG PO CAPS
ORAL_CAPSULE | ORAL | Status: AC
  Filled 2018-08-07: qty 2

## 2018-08-07 MED ORDER — ACETAMINOPHEN 500 MG PO TABS
ORAL_TABLET | ORAL | Status: AC
  Administered 2018-08-07: 1000 mg via ORAL
  Filled 2018-08-07: qty 2

## 2018-08-07 MED ORDER — PROPOFOL 10 MG/ML IV BOLUS
INTRAVENOUS | Status: DC | PRN
  Administered 2018-08-07: 150 mg via INTRAVENOUS

## 2018-08-07 MED ORDER — ONDANSETRON HCL 4 MG/2ML IJ SOLN
INTRAMUSCULAR | Status: AC
  Filled 2018-08-07: qty 2

## 2018-08-07 SURGICAL SUPPLY — 71 items
BAG URINE DRAINAGE (UROLOGICAL SUPPLIES) ×3 IMPLANT
BLADE SURG SZ11 CARB STEEL (BLADE) ×3 IMPLANT
CATH FOLEY 2WAY  5CC 16FR (CATHETERS) ×1
CATH ROBINSON RED A/P 16FR (CATHETERS) ×3 IMPLANT
CATH URTH 16FR FL 2W BLN LF (CATHETERS) ×2 IMPLANT
CHLORAPREP W/TINT 26ML (MISCELLANEOUS) ×3 IMPLANT
CORD MONOPOLAR M/FML 12FT (MISCELLANEOUS) ×3 IMPLANT
COUNTER NEEDLE 20/40 LG (NEEDLE) ×3 IMPLANT
COVER LIGHT HANDLE STERIS (MISCELLANEOUS) ×3 IMPLANT
COVER WAND RF STERILE (DRAPES) IMPLANT
DERMABOND ADVANCED (GAUZE/BANDAGES/DRESSINGS) ×1
DERMABOND ADVANCED .7 DNX12 (GAUZE/BANDAGES/DRESSINGS) ×2 IMPLANT
DEVICE SUTURE ENDOST 10MM (ENDOMECHANICALS) ×3 IMPLANT
DRAPE GENERAL ENDO 106X123.5 (DRAPES) ×3 IMPLANT
DRAPE LEGGINS SURG 28X43 STRL (DRAPES) ×3 IMPLANT
DRAPE STERI POUCH LG 24X46 STR (DRAPES) ×3 IMPLANT
DRSG TEGADERM 2-3/8X2-3/4 SM (GAUZE/BANDAGES/DRESSINGS) ×9 IMPLANT
FORCEPS BIOPSY CUP 10FR (INSTRUMENTS) ×3 IMPLANT
GLOVE BIO SURGEON STRL SZ7 (GLOVE) ×9 IMPLANT
GLOVE INDICATOR 7.5 STRL GRN (GLOVE) ×3 IMPLANT
GLOVE PI ORTHOPRO 6.5 (GLOVE) ×1
GLOVE PI ORTHOPRO STRL 6.5 (GLOVE) ×2 IMPLANT
GLOVE SURG SYN 6.5 ES PF (GLOVE) ×3 IMPLANT
GOWN STRL REUS W/ TWL LRG LVL3 (GOWN DISPOSABLE) ×4 IMPLANT
GOWN STRL REUS W/ TWL XL LVL3 (GOWN DISPOSABLE) ×2 IMPLANT
GOWN STRL REUS W/TWL LRG LVL3 (GOWN DISPOSABLE) ×2
GOWN STRL REUS W/TWL XL LVL3 (GOWN DISPOSABLE) ×1
GRASPER SUT TROCAR 14GX15 (MISCELLANEOUS) ×3 IMPLANT
IRRIGATION STRYKERFLOW (MISCELLANEOUS) ×2 IMPLANT
IRRIGATOR STRYKERFLOW (MISCELLANEOUS) ×3
IV LACTATED RINGERS 1000ML (IV SOLUTION) IMPLANT
IV NS 1000ML (IV SOLUTION) ×1
IV NS 1000ML BAXH (IV SOLUTION) ×2 IMPLANT
KIT PINK PAD W/HEAD ARE REST (MISCELLANEOUS) ×3
KIT PINK PAD W/HEAD ARM REST (MISCELLANEOUS) ×2 IMPLANT
KIT TURNOVER CYSTO (KITS) ×3 IMPLANT
LABEL OR SOLS (LABEL) ×3 IMPLANT
LIGASURE VESSEL 5MM BLUNT TIP (ELECTROSURGICAL) ×3 IMPLANT
MANIPULATOR VCARE LG CRV RETR (MISCELLANEOUS) IMPLANT
MANIPULATOR VCARE SML CRV RETR (MISCELLANEOUS) ×3 IMPLANT
MANIPULATOR VCARE STD CRV RETR (MISCELLANEOUS) ×3 IMPLANT
NS IRRIG 500ML POUR BTL (IV SOLUTION) ×3 IMPLANT
OCCLUDER COLPOPNEUMO (BALLOONS) ×3 IMPLANT
PACK GYN LAPAROSCOPIC (MISCELLANEOUS) ×3 IMPLANT
PAD OB MATERNITY 4.3X12.25 (PERSONAL CARE ITEMS) ×3 IMPLANT
PAD PREP 24X41 OB/GYN DISP (PERSONAL CARE ITEMS) ×3 IMPLANT
PORT ACCESS TROCAR AIRSEAL 5 (TROCAR) IMPLANT
POUCH SPECIMEN RETRIEVAL 10MM (ENDOMECHANICALS) IMPLANT
SCISSORS METZENBAUM CVD 33 (INSTRUMENTS) ×3 IMPLANT
SET CYSTO W/LG BORE CLAMP LF (SET/KITS/TRAYS/PACK) ×3 IMPLANT
SET TRI-LUMEN FLTR TB AIRSEAL (TUBING) ×3 IMPLANT
SLEEVE ENDOPATH XCEL 5M (ENDOMECHANICALS) IMPLANT
SPONGE GAUZE 2X2 8PLY STRL LF (GAUZE/BANDAGES/DRESSINGS) ×6 IMPLANT
STRIP CLOSURE SKIN 1/4X4 (GAUZE/BANDAGES/DRESSINGS) IMPLANT
SURGILUBE 2OZ TUBE FLIPTOP (MISCELLANEOUS) ×3 IMPLANT
SUT ENDO VLOC 180-0-8IN (SUTURE) ×6 IMPLANT
SUT MNCRL 4-0 (SUTURE)
SUT MNCRL 4-0 27XMFL (SUTURE)
SUT MNCRL AB 4-0 PS2 18 (SUTURE) ×3 IMPLANT
SUT VIC AB 0 CT1 36 (SUTURE) ×6 IMPLANT
SUT VIC AB 2-0 UR6 27 (SUTURE) ×3 IMPLANT
SUT VIC AB 4-0 SH 27 (SUTURE)
SUT VIC AB 4-0 SH 27XANBCTRL (SUTURE) IMPLANT
SUTURE MNCRL 4-0 27XMF (SUTURE) IMPLANT
SYR 10ML LL (SYRINGE) ×3 IMPLANT
SYR 50ML LL SCALE MARK (SYRINGE) ×3 IMPLANT
TROCAR ENDO BLADELESS 11MM (ENDOMECHANICALS) IMPLANT
TROCAR PORT AIRSEAL 8X100 (TROCAR) ×3 IMPLANT
TROCAR XCEL NON-BLD 5MMX100MML (ENDOMECHANICALS) ×3 IMPLANT
TUBING INSUF HEATED (TUBING) ×3 IMPLANT
TUBING INSUFFLATION (TUBING) ×3 IMPLANT

## 2018-08-07 NOTE — Progress Notes (Signed)
Patient resting comfortably with eyes closed.  No complaints or concerns.

## 2018-08-07 NOTE — Interval H&P Note (Signed)
History and Physical Interval Note:  08/07/2018 9:24 AM  Amber Weaver  has presented today for surgery, with the diagnosis of persistant postmenopausal bleeding  The various methods of treatment have been discussed with the patient and family. After consideration of risks, benefits and other options for treatment, the patient has consented to  Procedure(s): HYSTERECTOMY TOTAL LAPAROSCOPIC (N/A) LAPAROSCOPIC BILATERAL SALPINGECTOMY (Bilateral) CYSTOSCOPY (N/A) as a surgical intervention .  The patient's history has been reviewed, patient examined, no change in status, stable for surgery.  I have reviewed the patient's chart and labs.  Questions were answered to the patient's satisfaction.    She is very nervous  [facility]	[place]

## 2018-08-07 NOTE — Anesthesia Procedure Notes (Signed)
Procedure Name: Intubation Date/Time: 08/07/2018 10:47 AM Performed by: Timoteo Expose, CRNA Pre-anesthesia Checklist: Patient identified, Emergency Drugs available, Suction available, Patient being monitored and Timeout performed Patient Re-evaluated:Patient Re-evaluated prior to induction Oxygen Delivery Method: Circle system utilized Preoxygenation: Pre-oxygenation with 100% oxygen Induction Type: IV induction Laryngoscope Size: Mac and 3 Grade View: Grade II Tube type: Oral Tube size: 7.0 mm Number of attempts: 2 Airway Equipment and Method: Stylet Placement Confirmation: ETT inserted through vocal cords under direct vision,  positive ETCO2,  CO2 detector and breath sounds checked- equal and bilateral Secured at: 21 cm Tube secured with: Tape Dental Injury: Teeth and Oropharynx as per pre-operative assessment  Difficulty Due To: Difficulty was unanticipated

## 2018-08-07 NOTE — Op Note (Addendum)
Melody Haver PROCEDURE DATE: 08/07/2018  PREOPERATIVE DIAGNOSIS: Persistent postmenopausal bleeding POSTOPERATIVE DIAGNOSIS: The same + RLQ adhesions PROCEDURE: Total laparoscopic hysterectomy, bilateral salpingectomy, cystoscopy SURGEON:  Dr. Benjaman Kindler ASSISTANT: Dr. Vikki Ports Ward Anesthesiologist:  Anesthesiologist: Molli Barrows, MD CRNA: Doreen Salvage, CRNA; Timoteo Expose, CRNA  INDICATIONS: 52 y.o.  here for definitive surgical management secondary to the indications listed under preoperative diagnoses; please see preoperative note for further details. (Pt with persistent and recurrent postmenopausal bleeding, with a thickened endometrial stripe. We have treated with progesterone without resolution, but embx have been benign.Ultrasound with 79mm stripe, small fibroid, 8x5x4cm uterus.)    Risks of surgery were discussed with the patient including but not limited to: bleeding which may require transfusion or reoperation; infection which may require antibiotics; injury to bowel, bladder, ureters or other surrounding organs; need for additional procedures; thromboembolic phenomenon, incisional problems and other postoperative/anesthesia complications. Written informed consent was obtained.    FINDINGS: Lysis of RLQ adhesions, Small normal uterus, tubes and ovaries. Redundant bowel  ANESTHESIA:    General INTRAVENOUS FLUIDS:800  ml ESTIMATED BLOOD LOSS:minimal URINE OUTPUT: 200 ml   SPECIMENS: Uterus, cervix, bilateral fallopian tubes  COMPLICATIONS: None immediate  PROCEDURE IN DETAIL:  The patient received prophalactic intravenous antibiotics and had sequential compression devices applied to her lower extremities while in the preoperative area.  She was then taken to the operating room where general anesthesia was administered and was found to be adequate.  She was placed in the dorsal lithotomy position, and was prepped and draped in a sterile manner.  A formal time out  was performed with all team members present and in agreement.  A V-care uterine manipulator was placed at this time.  A Foley catheter was inserted into her bladder and attached to constant drainage. Attention was turned to the abdomen and 0.5% Marcaine infused subq. A 37mm umbilical incision was made with the scalpel.  The Optiview 5-mm trocar and sleeve were then advanced without difficulty with the laparoscope under direct visualization into the abdomen.  The abdomen was then insufflated with carbon dioxide gas and adequate pneumoperitoneum was obtained.  A survey of the patient's pelvis and abdomen revealed the findings above.    Bilateral lower quadrant ports (5 mm on the right and 11 mm on the left) were then placed under direct visualization.  The RLQ adhesions were removed bluntly and with the Ligasure device when appropriate. The pelvis was then carefully examined.    Attention was turned to the fallopian tubes; these were freed from the underlying mesosalpinx and the uterine attachments using the Ligasure device.  The bilateral round and broad ligaments were then clamped and transected with the Ligasure device.  The uterine artery was then skeletonized and a bladder flap was created.  The ureters were noted to be safely away from the area of dissection.  The bladder was then bluntly dissected off the lower uterine segment.    At this point, attention was turned to the uterine vessels, which were clamped and cauterized using the Ligasure on the left, and then the right. After the uterine blood flow at the level of the internal os was controlled, both arteries were cut with the Ligasure.  Good hemostasis was noted overall.  The uterosacral and cardinal ligaments were clamped, cut and ligated bilaterally .  Attention was then turned to the cervicovaginal junction, and monopolar scissors were used to transect the cervix from the surrounding vagina using the ring of the V-care as a guide.  This was done  circumferentially allowing total hysterectomy.  The uterus was then removed from the vagina and the vaginal cuff incision was then closed with running V-loc suture.  Overall excellent hemostasis was noted.    Attention was returned to the abdomen.The ureters were reexamined bilaterally and were pulsating normally. The abdominal pressure was reduced and hemostasis was confirmed. Cystoscopy showed bilateral ureteral jets.  No stitches were visualized in the bladder during cystoscopy.  The 53mm port fascia was closed with a vertical mattress with 0-Vicryl, using the cone closure system. All trocars were removed under direct visualization, and the abdomen was desufflated.  All skin incisions were closed with 4-0 Vicryl subcuticular stitches and Dermabond. The patient tolerated the procedures well.  All instruments, needles, and sponge counts were correct x 2. The patient was taken to the recovery room awake, extubated and in stable condition.

## 2018-08-07 NOTE — Transfer of Care (Signed)
Immediate Anesthesia Transfer of Care Note  Patient: Amber Weaver  Procedure(s) Performed: HYSTERECTOMY TOTAL LAPAROSCOPIC (N/A ) LAPAROSCOPIC BILATERAL SALPINGECTOMY (Bilateral ) CYSTOSCOPY (N/A )  Patient Location: PACU  Anesthesia Type:General  Level of Consciousness: awake  Airway & Oxygen Therapy: Patient Spontanous Breathing  Post-op Assessment: Report given to RN  Post vital signs: Reviewed  Last Vitals:  Vitals Value Taken Time  BP 133/65 08/07/2018 12:30 PM  Temp 36.6 C 08/07/2018 12:30 PM  Pulse 77 08/07/2018 12:35 PM  Resp 19 08/07/2018 12:35 PM  SpO2 98 % 08/07/2018 12:35 PM  Vitals shown include unvalidated device data.  Last Pain:  Vitals:   08/07/18 1230  TempSrc:   PainSc: Asleep         Complications: No apparent anesthesia complications

## 2018-08-07 NOTE — Anesthesia Post-op Follow-up Note (Signed)
Anesthesia QCDR form completed.        

## 2018-08-07 NOTE — Discharge Instructions (Signed)
Discharge instructions after   total laparoscopic hysterectomy   For the next three days, take ibuprofen and acetaminophen on a schedule, every 8 hours. You can take them together or you can intersperse them, and take one every four hours. I also gave you gabapentin for nighttime, to help you sleep and also to control pain. Take gabapentin medicines at night for at least the next 3 nights. You also have a narcotic, oxycodone, to take as needed if the above medicines don't help.  Postop constipation is a major cause of pain. Stay well hydrated, walk as you tolerate, and take over the counter senna as well as stool softeners if you need them.    Signs and Symptoms to Report Call our office at [phone] if you have any of the following.  . Fever over 100.4 degrees or higher . Severe stomach pain not relieved with pain medications . Bright red bleeding that's heavier than a period that does not slow with rest . To go the bathroom a lot (frequency), you can't hold your urine (urgency), or it hurts when you empty your bladder (urinate) . Chest pain . Shortness of breath . Pain in the calves of your legs . Severe nausea and vomiting not relieved with anti-nausea medications . Signs of infection around your wounds, such as redness, hot to touch, swelling, green/yellow drainage (like pus), bad smelling discharge . Any concerns  What You Can Expect after Surgery . You may see some pink tinged, bloody fluid and bruising around the wound. This is normal. . You may notice shoulder and neck pain. This is caused by the gas used during surgery to expand your abdomen so your surgeon could get to the uterus easier. . You may have a sore throat because of the tube in your mouth during general anesthesia. This will go away in 2 to 3 days. . You may have some stomach cramps. . You may notice spotting on your panties. . You may have pain around the incision sites.   Activities after Your  Discharge Follow these guidelines to help speed your recovery at home: . Do the coughing and deep breathing as you did in the hospital for 2 weeks. Use the small blue breathing device, called the incentive spirometer for 2 weeks. . Don't drive if you are in pain or taking narcotic pain medicine. You may drive when you can safely slam on the brakes, turn the wheel forcefully, and rotate your torso comfortably. This is typically 1-2 weeks. Practice in a parking lot or side street prior to attempting to drive regularly.  . Ask others to help with household chores for 4 weeks. . Do not lift anything heavier that 10 pounds for 4-6 weeks. This includes pets, children, and groceries. . Don't do strenuous activities, exercises, or sports like vacuuming, tennis, squash, etc. until your doctor says it is safe to do so. ---Maintain pelvic rest for 8 weeks. This means nothing in the vagina or rectum at all (no douching, tampons, intercourse) for 8 weeks.  . Walk as you feel able. Rest often since it may take two or three weeks for your energy level to return to normal.  . You may climb stairs . Avoid constipation:   -Eat fruits, vegetables, and whole grains. Eat small meals as your appetite will take time to return to normal.   -Drink 6 to 8 glasses of water each day unless your doctor has told you to limit your fluids.   -Use a laxative   or stool softener as needed if constipation becomes a problem. You may take Miralax, metamucil, Citrucil, Colace, Senekot, FiberCon, etc. If this does not relieve the constipation, try two tablespoons of Milk Of Magnesia every 8 hours until your bowels move.   You may shower. Gently wash the wounds with a mild soap and water. Pat dry.  Do not get in a hot tub, swimming pool, etc. for 6 weeks.  Do not use lotions, oils, powders on the wounds.  Do not douche, use tampons, or have sex until your doctor says it is okay.  Take your pain medicine when you need it. The medicine  may not work as well if the pain is bad.  Take the medicines you were taking before surgery. Other medications you will need are pain medications and possibly constipation and nausea medications (Zofran).  Here is a helpful article from the website [employer], regarding constipation  Here are reasons why constipation occurs after surgery: 1) During the operation and in the recovery room, most people are given opioid pain medication, primarily through an IV, to treat moderate or severe pain. Intravenous opioids include morphine, Dilaudid and fentanyl. After surgery, patients are often prescribed opioid pain medication to take by mouth at home, including codeine, Vicodin, Norco, and Percocet. All of these medications cause constipation by slowing down the movement of your intestine. 2) Changes in your diet before surgery can be another culprit. It is common to get specific instructions to change how you normally eat or drink before your surgery, like only having liquids the day before or not having anything to eat or drink after midnight the night before surgery. For this reason, temporary dehydration may occur. This, along with not eating or only having liquids, means that you are getting less fiber than usual. Both these factors contribute to constipation. 3) Changes in your diet after surgery can also contribute to the problem. Although many people dont have dietary restrictions after operations, being under anesthesia can make you lose your appetite for several hours and maybe even days. Some people can even have nausea or vomiting. Not eating or drinking normally means that you are not getting enough fiber and you can get dehydrated, both leading to constipation. 4) Lying in a bed more than usual--which happens before, during and after surgery--combined with the medications and diet changes, all work together to slow down your colon and make your poop turn to rock.   CONSTIPATION  PREVENTION:  No one likes to be constipated.  Lets face it, its not a pleasant feeling when you dont poop for days, then strain on the toilet to finally pass something large enough to cause damage. An ounce of prevention is worth a pound of cure, so: 1. Assume you will be constipated. 2. Plan and prepare accordingly. Post-surgery is one of those unique situations where the temporary use of laxatives can make a world of difference. Always consult with your doctor, and recognize that if you wait several days after surgery to take a laxative, the constipation might be too severe for these over-the-counter options. It is always important to discuss all medications you plan on taking with your doctor. Ask your doctor if you can start the laxative immediately after surgery. *  Here are go-to post-surgery laxatives: Senna: Senna is an herb that acts as a stimulant laxative, meaning it increases the activity of the intestine to cause you to have a bowel movement. It comes in many forms, but senna pills are easy to  take and are sold over the counter at almost all pharmacies. Since opioid pain medications slow down the activity of the intestine, it makes sense to take a medication to help reverse that side effect. Long-term use of a stimulant laxative is not a good idea since it can make your colon lazy and not function properly; however, temporary use immediately after surgery is acceptable. In general, if you are able to eat a normal diet, taking senna soon after surgery works the best. Senna usually works within hours to produce a bowel movement, but this is less predictable when you are taking different medications after surgery. Try not to wait several days to start taking senna, as often it is too late by then. Just like with all medications or supplements, check with your doctor before starting new treatment.   Magnesium: Magnesium is an important mineral that our body needs. We get magnesium from  some foods that we eat, especially foods that are high in fiber such as broccoli, almonds and whole grains. There are also magnesium-based medications used to treat constipation including milk of magnesia (magnesium hydroxide), magnesium citrate and magnesium oxide. They work by drawing water into the intestine, putting it into the class of osmotic laxatives. Magnesium products in low doses appear to be safe, but if taken in very large doses, can lead to problems such as irregular heartbeat, low blood pressure and even death. It can also affect other medications you might be taking, therefore it is important to discuss using magnesium with your physician and pharmacist before initiating therapy. Most over-the-counter magnesium laxatives work very well to help with the constipation related to surgery, but sometimes they work too well and lead to diarrhea. Make sure you are somewhere with easy access to a bathroom, just in case.   Bisacodyl: Bisacodyl (generic name) is sold under brand names such as Dulcolax. Much like senna, it is a stimulant laxative, meaning it makes your intestines move more quickly to push out the stool. This is another good choice to start taking as soon as your doctor says you can take a laxative after surgery. It comes in pill form and as a suppository, which is a good choice for people who cannot or are not allowed to swallow pills. Studies have shown that it works as a laxative, but like most of these medications, you should use this on a short-term basis only.   Enema: Enemas strike fear in many people, but FEAR NOT! Its nowhere near as big a deal as you may think. An enema is just a way to get some liquid into your rectum by placing a specially designed device through your anus. If you have never done one, it might seem like a painful, unpleasant, uncomfortable, complicated and lengthy procedure. But in reality, its simple, takes just a few seconds and is highly effective. The  small ready-made bottles you buy at the pharmacy are much easier than the hose/large rubber container type. Those recommended positions illustrated in some instructions are generally not necessary to place the enema. Its very similar to the insertion of a tampon, requiring a slight squat. Some extra lubrication on the enemas tip (or on your anus) will make it a breeze. In certain cases, there is no substitute for a good enema. For example, if someone has not pooped for a few days, the beginning of the poop waiting to come out can become rock hard. Passing that hard stool can lead to much pain and problems like anal  fissures. Inserting a little liquid to break up the rock-hard stool will help make its passage much easier. Enemas come with different liquids. Most come with saline, but there are also mineral oil options. You can also use warm water in the reusable enema containers. They all work. But since saline can sometimes be irritating, so try a mineral oil or water enema instead.  Here are commonly recommended constipation medications that do not work well for post-surgery constipation: Docusate: Docusate (generic name) most commonly referred to as Colace (brand name) is not really a laxative, but is classified as a stool softener. Although this medication is commonly prescribed, it is not recommended for several reasons: 1) there is no good medical evidence that it works 2) even if it has an effect, which is very questionable, it is minimal and cannot combat the intestinal slowing caused by the opioid medications. Skip docusate to save money and space in your pillbox for something more effective.  PEG: Miralax (brand name) is basically a chemical called polyethylene glycol (PEG) and it has gained tremendous popularity as a laxative. This product is an osmotic laxative meaning it works by pulling water into the stool, making it softer. This is very similar to the action of natural fiber in foods and  supplements. Therefore, the effect seen by this medication is not immediate, causing a bowel movement in a day or more. Is this medication strong enough to battle the constipation related to having an operation? Maybe for some people not prone to constipation. But for most people, other laxatives are better to prevent constipation after surgery.

## 2018-08-07 NOTE — Anesthesia Preprocedure Evaluation (Signed)
Anesthesia Evaluation  Patient identified by MRN, date of birth, ID band Patient awake    Reviewed: Allergy & Precautions, H&P , NPO status , Patient's Chart, lab work & pertinent test results, reviewed documented beta blocker date and time   History of Anesthesia Complications (+) PONV and history of anesthetic complications  Airway Mallampati: II  TM Distance: >3 FB Neck ROM: full    Dental  (+) Teeth Intact   Pulmonary neg shortness of breath, asthma ,    Pulmonary exam normal        Cardiovascular Exercise Tolerance: Good Normal cardiovascular exam+ dysrhythmias  Rhythm:regular Rate:Normal     Neuro/Psych  Headaches, Anxiety negative psych ROS   GI/Hepatic negative GI ROS, Neg liver ROS,   Endo/Other  negative endocrine ROS  Renal/GU negative Renal ROS  negative genitourinary   Musculoskeletal   Abdominal   Peds  Hematology negative hematology ROS (+)   Anesthesia Other Findings Past Medical History: No date: Anxiety No date: Asthma No date: Headache     Comment:  migraines 10/2017: PONV (postoperative nausea and vomiting)     Comment:  one time N/V after d/c home No date: PVC (premature ventricular contraction) Past Surgical History: 11/07/2017: HYSTEROSCOPY W/D&C; N/A     Comment:  Procedure: DILATATION AND CURETTAGE /HYSTEROSCOPY;                Surgeon: Benjaman Kindler, MD;  Location: [facility] ORS;                Service: Gynecology;  Laterality: N/A;   Reproductive/Obstetrics negative OB ROS                             Anesthesia Physical Anesthesia Plan  ASA: II  Anesthesia Plan: General ETT   Post-op Pain Management:    Induction:   PONV Risk Score and Plan:   Airway Management Planned:   Additional Equipment:   Intra-op Plan:   Post-operative Plan:   Informed Consent: I have reviewed the patients History and Physical, chart, labs and discussed the  procedure including the risks, benefits and alternatives for the proposed anesthesia with the patient or authorized representative who has indicated his/her understanding and acceptance.   Dental Advisory Given  Plan Discussed with: CRNA  Anesthesia Plan Comments:         Anesthesia Quick Evaluation

## 2018-08-09 ENCOUNTER — Encounter: Payer: Self-pay | Admitting: Obstetrics and Gynecology

## 2018-08-10 NOTE — Anesthesia Postprocedure Evaluation (Signed)
Anesthesia Post Note  Patient: CHICQUITA MENDEL  Procedure(s) Performed: HYSTERECTOMY TOTAL LAPAROSCOPIC (N/A ) LAPAROSCOPIC BILATERAL SALPINGECTOMY (Bilateral ) CYSTOSCOPY (N/A )  Patient location during evaluation: PACU Anesthesia Type: General Level of consciousness: awake and alert Pain management: pain level controlled Vital Signs Assessment: post-procedure vital signs reviewed and stable Respiratory status: spontaneous breathing, nonlabored ventilation, respiratory function stable and patient connected to nasal cannula oxygen Cardiovascular status: blood pressure returned to baseline and stable Postop Assessment: no apparent nausea or vomiting Anesthetic complications: no     Last Vitals:  Vitals:   08/07/18 1317 08/07/18 1635  BP: 121/68 127/68  Pulse: 73 78  Resp: 16 14  Temp: (!) 36.1 C   SpO2: 100% 100%    Last Pain:  Vitals:   08/07/18 1317  TempSrc: Temporal  PainSc: 5                  Molli Barrows

## 2018-08-11 LAB — SURGICAL PATHOLOGY

## 2019-01-22 IMAGING — MG MM DIGITAL DIAGNOSTIC BILAT W/ TOMO W/ CAD
9 of 12 series · 9 of 28 positions shown · non-contrast
Comparison: Previous exam(s).

CLINICAL DATA: 51-year-old female who reports left breast
enlargement over the past several months. She denies any lumps. She
also notes tenderness on the underside of both breasts when she lies
on them during this time. She notes that she has gained
approximately 10 pounds since [REDACTED] of this year. She is
perimenopausal.

EXAM:
2D DIGITAL DIAGNOSTIC BILATERAL MAMMOGRAM WITH CAD AND ADJUNCT TOMO

[L CC]
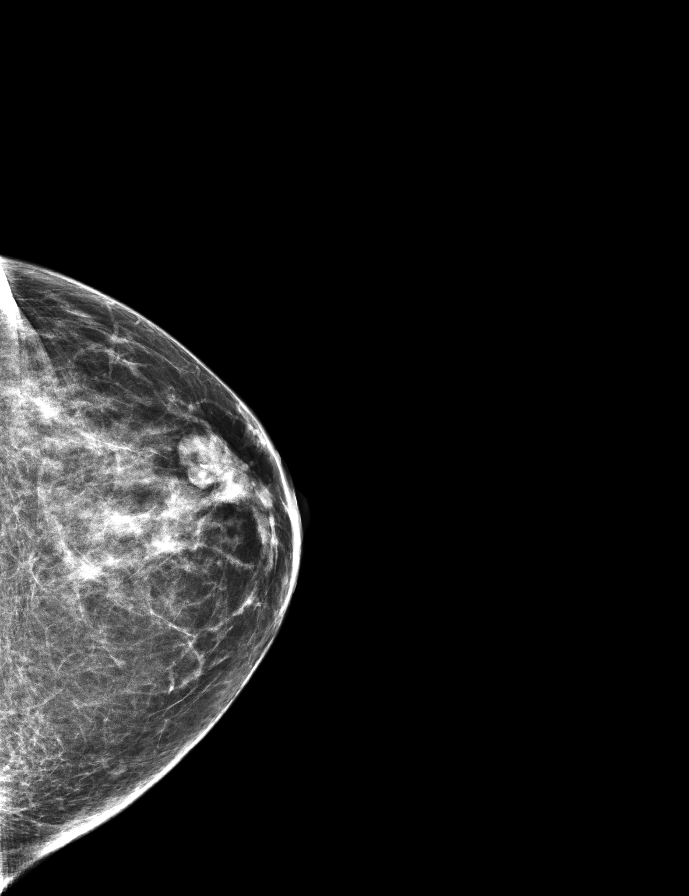

[L CC synth-2D]
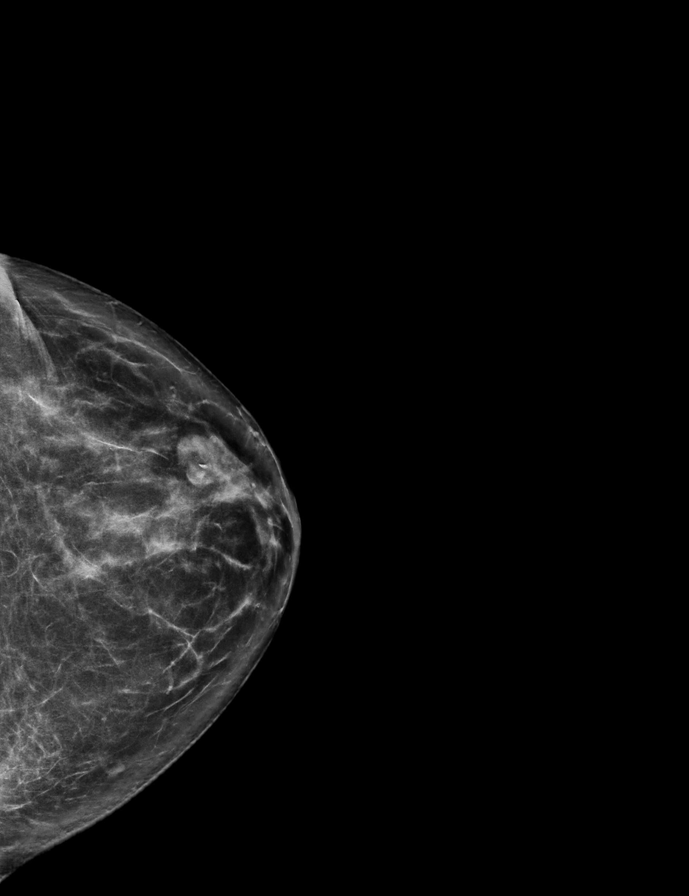

[L MLO]
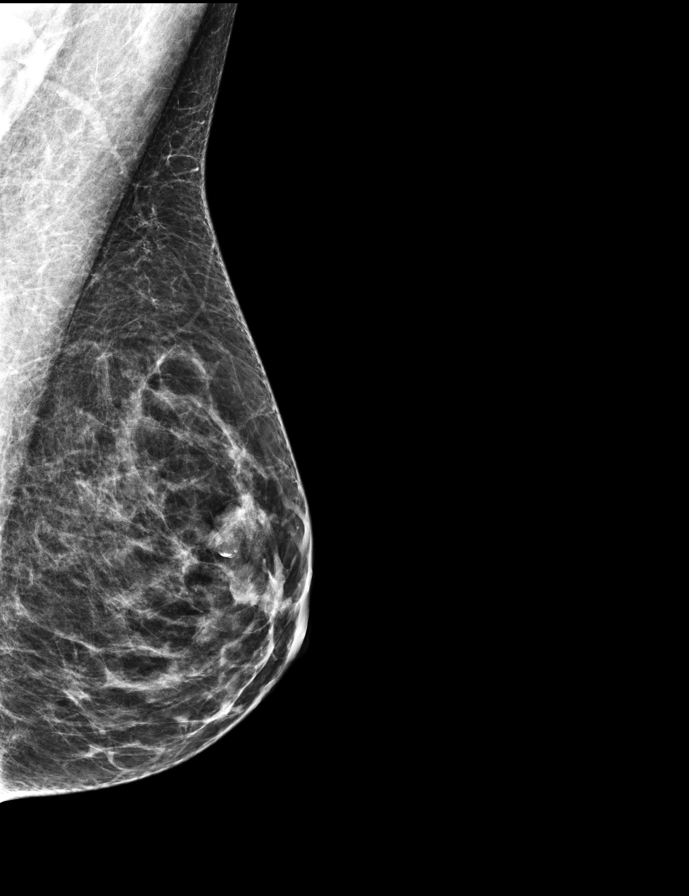

[L MLO synth-2D]
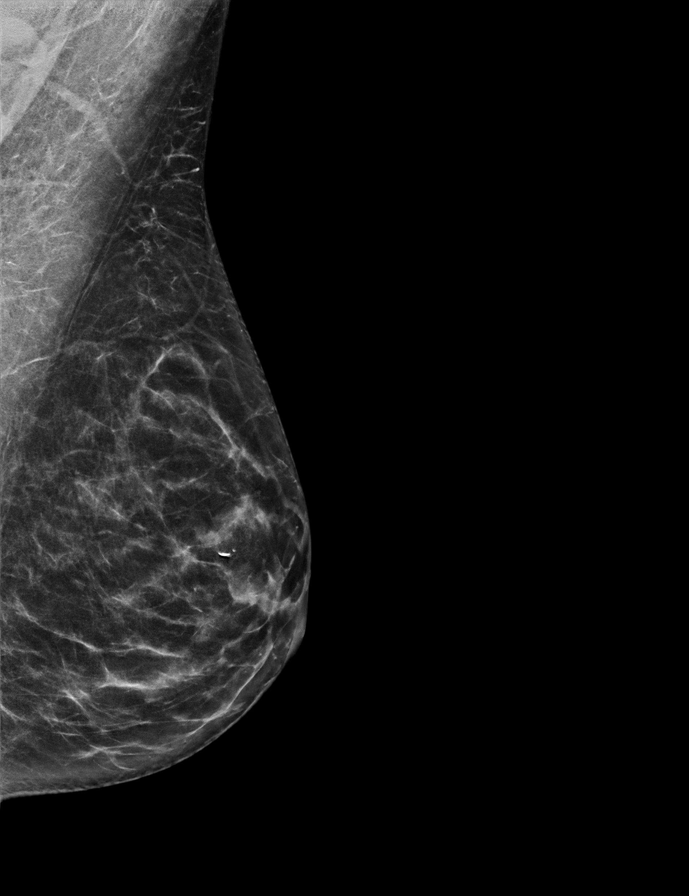

[R MLO synth-2D]
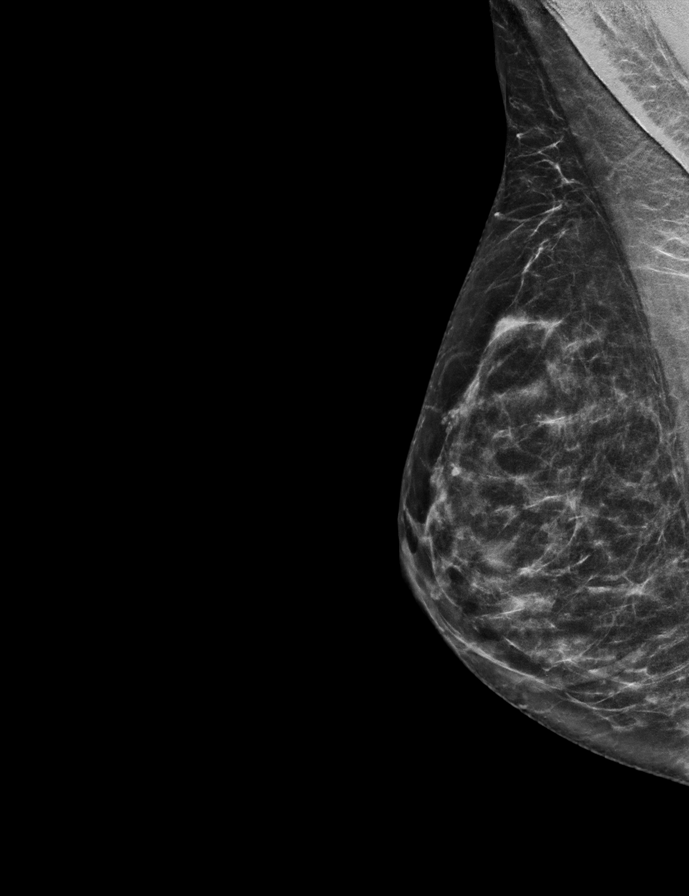

[R CC]
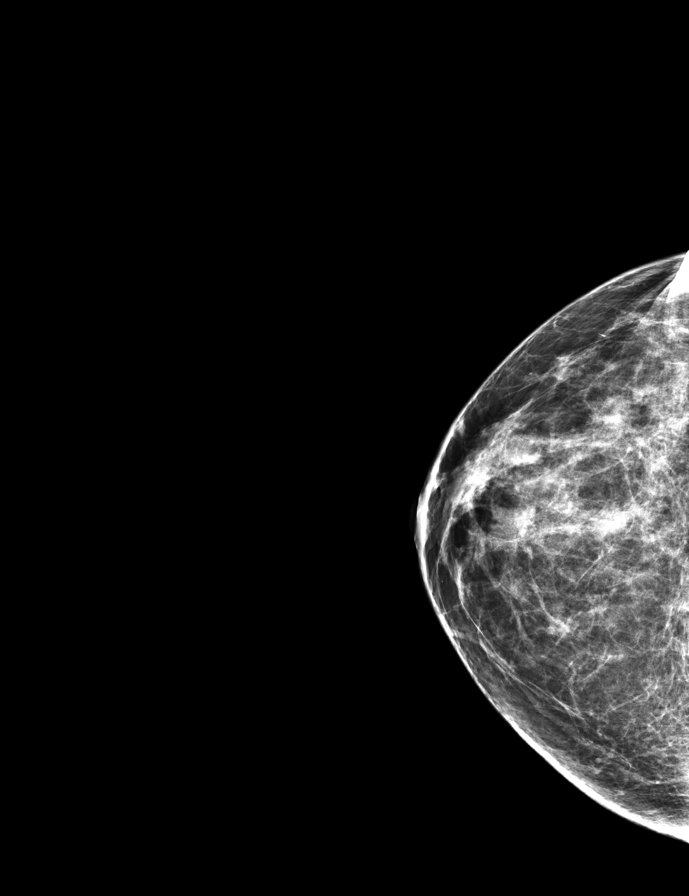

[R CC synth-2D]
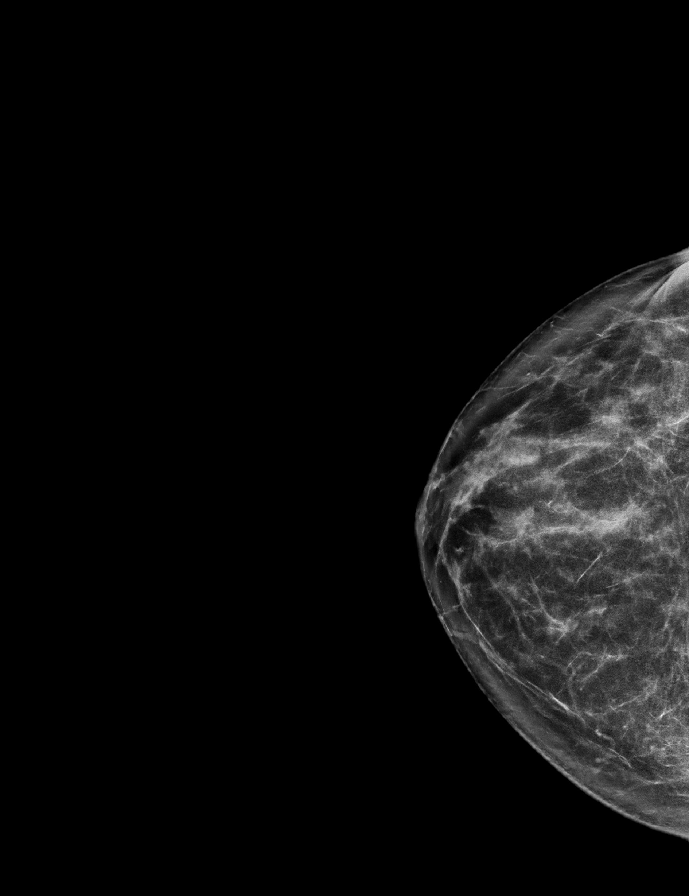

[R MLO]
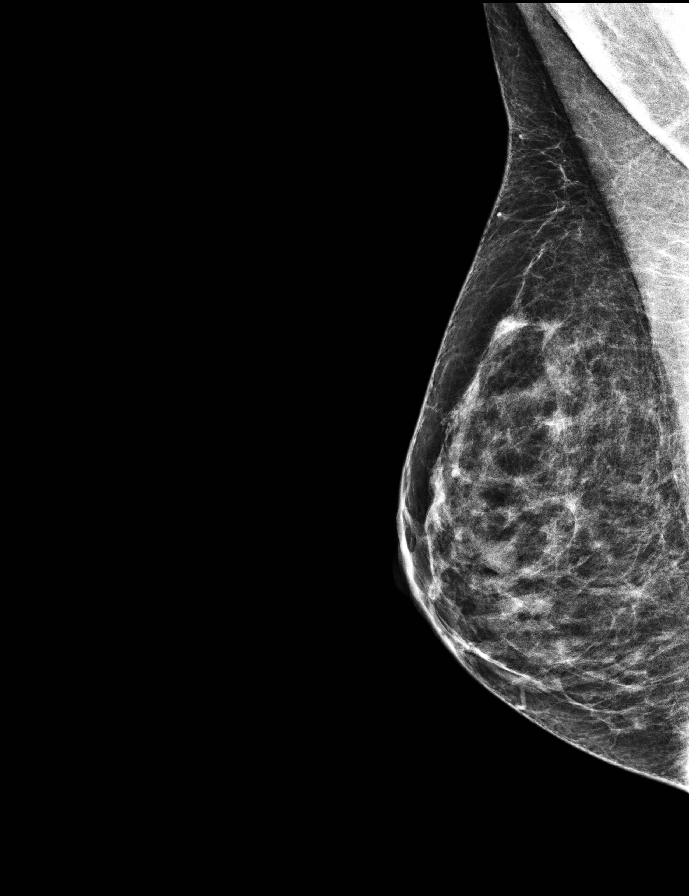

[R CC tomo · tomo slice 34/67.0]
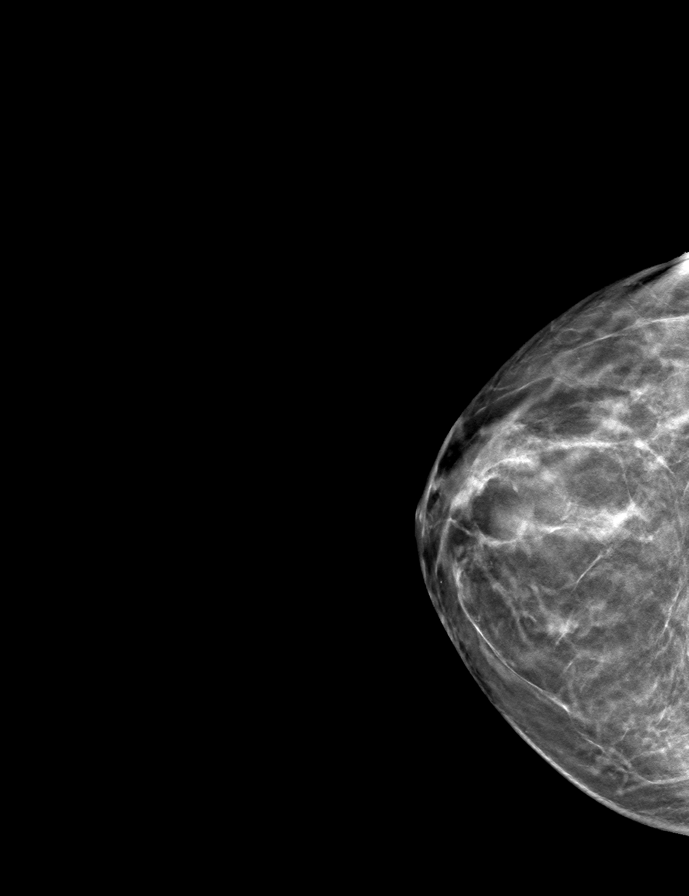

[9 of 28 positions shown; findings below may reference images not displayed]

ACR Breast Density Category c: The breast tissue is heterogeneously
dense, which may obscure small masses.
FINDINGS: No suspicious mass, calcifications, or other abnormality is
identified within either breast. Both of the patient's breasts
appear slightly larger when compared to previous years. The left
breast has always been slightly larger than the right.

Mammographic images were processed with CAD.
IMPRESSION: No mammographic evidence of malignancy.

RECOMMENDATION:
Screening mammogram in one year.(Code:FX-1-4AP)

I have discussed the findings and recommendations with the patient.
Results were also provided in writing at the conclusion of the
visit. If applicable, a reminder letter will be sent to the patient
regarding the next appointment.

BI-RADS CATEGORY  1: Negative.

## 2019-06-23 ENCOUNTER — Other Ambulatory Visit: Payer: Self-pay | Admitting: Pediatrics

## 2019-06-23 DIAGNOSIS — Z1231 Encounter for screening mammogram for malignant neoplasm of breast: Secondary | ICD-10-CM

## 2019-07-02 ENCOUNTER — Other Ambulatory Visit: Payer: Self-pay | Admitting: Pediatrics

## 2019-07-02 ENCOUNTER — Ambulatory Visit
Admission: RE | Admit: 2019-07-02 | Discharge: 2019-07-02 | Disposition: A | Discharge status: Home / Self Care | Payer: BC Managed Care – PPO | Source: Ambulatory Visit | Attending: Pediatrics | Admitting: Pediatrics

## 2019-07-02 ENCOUNTER — Other Ambulatory Visit: Payer: Self-pay

## 2019-07-02 DIAGNOSIS — R1012 Left upper quadrant pain: Secondary | ICD-10-CM

## 2019-07-06 ENCOUNTER — Other Ambulatory Visit: Payer: Self-pay

## 2019-07-06 DIAGNOSIS — N938 Other specified abnormal uterine and vaginal bleeding: Secondary | ICD-10-CM | POA: Insufficient documentation

## 2019-07-06 DIAGNOSIS — G43009 Migraine without aura, not intractable, without status migrainosus: Secondary | ICD-10-CM | POA: Insufficient documentation

## 2019-07-06 DIAGNOSIS — N950 Postmenopausal bleeding: Secondary | ICD-10-CM | POA: Insufficient documentation

## 2019-07-06 DIAGNOSIS — N200 Calculus of kidney: Secondary | ICD-10-CM

## 2019-07-09 ENCOUNTER — Ambulatory Visit (INDEPENDENT_AMBULATORY_CARE_PROVIDER_SITE_OTHER): Payer: BC Managed Care – PPO | Admitting: Urology

## 2019-07-09 ENCOUNTER — Other Ambulatory Visit
Admission: RE | Admit: 2019-07-09 | Discharge: 2019-07-09 | Disposition: A | Discharge status: Home / Self Care | Payer: BC Managed Care – PPO | Attending: Urology | Admitting: Urology

## 2019-07-09 ENCOUNTER — Other Ambulatory Visit: Payer: Self-pay

## 2019-07-09 ENCOUNTER — Encounter: Payer: Self-pay | Admitting: Urology

## 2019-07-09 VITALS — BP 117/73 | HR 89 | Ht 66.0 in | Wt 145.0 lb

## 2019-07-09 DIAGNOSIS — R1012 Left upper quadrant pain: Secondary | ICD-10-CM

## 2019-07-09 DIAGNOSIS — N200 Calculus of kidney: Secondary | ICD-10-CM | POA: Diagnosis present

## 2019-07-09 LAB — URINALYSIS, COMPLETE (UACMP) WITH MICROSCOPIC
Bilirubin Urine: NEGATIVE
Glucose, UA: NEGATIVE mg/dL
Ketones, ur: NEGATIVE mg/dL
Nitrite: NEGATIVE
Protein, ur: NEGATIVE mg/dL
Specific Gravity, Urine: 1.01 (ref 1.005–1.030)
pH: 6 (ref 5.0–8.0)

## 2019-07-09 NOTE — Progress Notes (Signed)
07/09/2019 11:25 AM   Amber Weaver April 27, 1966 [id number]  Referring provider: Barbaraann Boys, MD [address] [place],  [place] [id number]  Chief Complaint  Patient presents with  . Nephrolithiasis    New Patient    HPI: 53 year old female who presents today for further evaluation of possible kidney stones.  She reports that over the past month or so, she has had intermittent pain underneath her left rib cage.  She reports that this pain comes and goes with varying severity is ranging from 2-5/10.  Its not associated with any particular nausea, vomiting, dysuria, gross hematuria or any other symptoms.  Tends to be exacerbated with movement and position alleviated with rest.    She does report that she has been dealing with this same issue for several years.  She initially thought it may be a gallbladder but it was on the wrong side.  She also has been avoiding high-fat diet as this seems to exacerbate her symptoms.  As part of her further evaluation, she was seen by her primary care physician at which time she was noted to have trace blood on urine dip.  No microscopic exam was done.  She underwent a noncontrast CT scan which showed punctate nonobstructing stone in the left kidney, bilateral ex renal pelvises but without any significant stone burden obstructing stones.  No personal history of kidney stones.  She does have some baseline urinary urgency.  She manages behaviorally and uses the bathroom before leaving the house as well as avoids excessive fluids when she is on trips.    PMH: Past Medical History:  Diagnosis Date  . Anxiety   . Asthma   . Headache    migraines  . PONV (postoperative nausea and vomiting) 10/2017   one time N/V after d/c home  . PVC (premature ventricular contraction)     Surgical History: Past Surgical History:  Procedure Laterality Date  . CYSTOSCOPY N/A 08/07/2018   Procedure: CYSTOSCOPY;  Surgeon: Benjaman Kindler, MD;  Location: [facility]  ORS;  Service: Gynecology;  Laterality: N/A;  . HYSTEROSCOPY W/D&C N/A 11/07/2017   Procedure: DILATATION AND CURETTAGE /HYSTEROSCOPY;  Surgeon: Benjaman Kindler, MD;  Location: [facility] ORS;  Service: Gynecology;  Laterality: N/A;  . LAPAROSCOPIC BILATERAL SALPINGECTOMY Bilateral 08/07/2018   Procedure: LAPAROSCOPIC BILATERAL SALPINGECTOMY;  Surgeon: Benjaman Kindler, MD;  Location: [facility] ORS;  Service: Gynecology;  Laterality: Bilateral;  . LAPAROSCOPIC HYSTERECTOMY N/A 08/07/2018   Procedure: HYSTERECTOMY TOTAL LAPAROSCOPIC;  Surgeon: Benjaman Kindler, MD;  Location: [facility] ORS;  Service: Gynecology;  Laterality: N/A;    Home Medications:  Allergies as of 07/09/2019      Reactions   Penicillins Other (See Comments)   Since childhood Has patient had a PCN reaction causing immediate rash, facial/tongue/throat swelling, SOB or lightheadedness with hypotension: no Has patient had a PCN reaction causing severe rash involving mucus membranes or skin necrosis: no Has patient had a PCN reaction that required hospitalization: no Has patient had a PCN reaction occurring within the last 10 years: no If all of the above answers are "NO", then may proceed with Cephalosporin use.   Vicodin [hydrocodone-acetaminophen] Nausea And Vomiting   Morphine And Related Other (See Comments)   Weird feeling      Medication List       Accurate as of July 09, 2019 11:25 AM. If you have any questions, ask your nurse or doctor.        STOP taking these medications   Black Cohosh 540 MG  Caps Stopped by: Hollice Espy, MD   docusate sodium 100 MG capsule Commonly known as: COLACE Stopped by: Hollice Espy, MD   ELDERBERRY PO Stopped by: Hollice Espy, MD   gabapentin 800 MG tablet Commonly known as: Neurontin Stopped by: Hollice Espy, MD   HYDROmorphone 2 MG tablet Commonly known as: DILAUDID Stopped by: Hollice Espy, MD   ibuprofen 800 MG tablet Commonly known as: ADVIL Stopped by: Hollice Espy, MD     TAKE these medications   albuterol 108 (90 Base) MCG/ACT inhaler Commonly known as: VENTOLIN HFA Inhale 2 puffs into the lungs every 4 (four) hours as needed for wheezing or shortness of breath.   cholecalciferol 25 MCG (1000 UT) tablet Commonly known as: VITAMIN D3 Take 1,000 Units by mouth daily.   Fluticasone-Salmeterol 250-50 MCG/DOSE Aepb Commonly known as: ADVAIR Inhale 1 puff into the lungs 2 (two) times daily as needed.       Allergies:  Allergies  Allergen Reactions  . Penicillins Other (See Comments)    Since childhood Has patient had a PCN reaction causing immediate rash, facial/tongue/throat swelling, SOB or lightheadedness with hypotension: no Has patient had a PCN reaction causing severe rash involving mucus membranes or skin necrosis: no Has patient had a PCN reaction that required hospitalization: no Has patient had a PCN reaction occurring within the last 10 years: no If all of the above answers are "NO", then may proceed with Cephalosporin use.    . Vicodin [Hydrocodone-Acetaminophen] Nausea And Vomiting  . Morphine And Related Other (See Comments)    Weird feeling     Family History: Family History  Problem Relation Age of Onset  . Hypertension Mother   . Stroke Father   . Breast cancer Neg Hx     Social History:  reports that she has never smoked. She has never used smokeless tobacco. She reports current alcohol use of about 1.0 standard drinks of alcohol per week. She reports that she does not use drugs.  ROS: UROLOGY Frequent Urination?: No Hard to postpone urination?: No Burning/pain with urination?: No Get up at night to urinate?: No Leakage of urine?: No Urine stream starts and stops?: No Trouble starting stream?: No Do you have to strain to urinate?: No Blood in urine?: No Urinary tract infection?: No Sexually transmitted disease?: No Injury to kidneys or bladder?: No Painful intercourse?: No Weak stream?: No  Currently pregnant?: No Vaginal bleeding?: No Last menstrual period?: n  Gastrointestinal Nausea?: No Vomiting?: No Indigestion/heartburn?: No Diarrhea?: No Constipation?: No  Constitutional Fever: No Night sweats?: Yes Weight loss?: No Fatigue?: No  Skin Skin rash/lesions?: No Itching?: No  Eyes Blurred vision?: No Double vision?: No  Ears/Nose/Throat Sore throat?: No Sinus problems?: No  Hematologic/Lymphatic Swollen glands?: No Easy bruising?: No  Cardiovascular Leg swelling?: No Chest pain?: No  Respiratory Cough?: No Shortness of breath?: No  Endocrine Excessive thirst?: No  Musculoskeletal Back pain?: Yes Joint pain?: No  Neurological Headaches?: No Dizziness?: No  Psychologic Depression?: No Anxiety?: No  Physical Exam: BP 117/73   Pulse 89   Ht 5\' 6"  (1.676 m)   Wt 145 lb (65.8 kg)   LMP 03/02/2016 Comment: menopausal  BMI 23.40 kg/m   Constitutional:  Alert and oriented, No acute distress. HEENT: [place] AT, moist mucus membranes.  Trachea midline, no masses. Cardiovascular: No clubbing, cyanosis, or edema. Respiratory: Normal respiratory effort, no increased work of breathing. Skin: No rashes, bruises or suspicious lesions. Neurologic: Grossly intact, no focal deficits, moving  all 4 extremities. Psychiatric: Normal mood and affect.  Laboratory Data: Lab Results  Component Value Date   WBC 5.1 07/21/2018   HGB 13.4 07/21/2018   HCT 41.7 07/21/2018   MCV 94.6 07/21/2018   PLT 238 07/21/2018    Lab Results  Component Value Date   CREATININE 0.59 07/21/2018    Urinalysis Results for orders placed or performed during the hospital encounter of 07/09/19  Urinalysis, Complete w Microscopic (For [facility] ONLY)  Result Value Ref Range   Color, Urine YELLOW YELLOW   APPearance CLEAR CLEAR   Specific Gravity, Urine 1.010 1.005 - 1.030   pH 6.0 5.0 - 8.0   Glucose, UA NEGATIVE NEGATIVE mg/dL   Hgb urine dipstick TRACE (A)  NEGATIVE   Bilirubin Urine NEGATIVE NEGATIVE   Ketones, ur NEGATIVE NEGATIVE mg/dL   Protein, ur NEGATIVE NEGATIVE mg/dL   Nitrite NEGATIVE NEGATIVE   Leukocytes,Ua TRACE (A) NEGATIVE   Squamous Epithelial / LPF 0-5 0 - 5   WBC, UA 0-5 0 - 5 WBC/hpf   RBC / HPF 0-5 0 - 5 RBC/hpf   Bacteria, UA FEW (A) NONE SEEN    Pertinent Imaging: Results for orders placed during the hospital encounter of 07/02/19  CT RENAL STONE STUDY   Narrative CLINICAL DATA:  Left mid abdominal and left flank pain for 2 weeks, microscopic hematuria, incontinence, history of hysterectomy  EXAM: CT ABDOMEN AND PELVIS WITHOUT CONTRAST  TECHNIQUE: Multidetector CT imaging of the abdomen and pelvis was performed following the standard protocol without IV contrast.  COMPARISON:  None.  FINDINGS: Lower chest: No acute abnormality.  Hepatobiliary: No solid liver abnormality is seen. No gallstones, gallbladder wall thickening, or biliary dilatation.  Pancreas: Unremarkable. No pancreatic ductal dilatation or surrounding inflammatory changes.  Spleen: Normal in size without significant abnormality.  Adrenals/Urinary Tract: Adrenal glands are unremarkable. There is a punctuate, nonobstructive calculus of the midportion of the left kidney (series 4, image 70). There are prominent bilateral extrarenal pelves. There are small rounded calculi in the vicinity of, but clearly distinct from the most distal left ureter, consistent with pelvic phleboliths (series 2, image 65, series 4, image 56). Bladder is unremarkable.  Stomach/Bowel: Stomach is within normal limits. Appendix appears normal. No evidence of bowel wall thickening, distention, or inflammatory changes. Occasional sigmoid diverticula.  Vascular/Lymphatic: No significant vascular findings are present. No enlarged abdominal or pelvic lymph nodes.  Reproductive: Status post hysterectomy.  Other: No abdominal wall hernia or abnormality. No  abdominopelvic ascites.  Musculoskeletal: No acute or significant osseous findings.  IMPRESSION: 1. Punctuate, nonobstructive left nephrolithiasis. No other evidence of urinary tract calculus. Prominent bilateral extrarenal pelves without hydronephrosis or hydroureter.  2.  Status post hysterectomy.   Electronically Signed   By: Eddie Candle M.D.   On: 07/02/2019 13:43    CT scan was personally reviewed today and images were also shared with the patient.  She has an extremely minute punctate left stone which is barely visible on CT scan.    Assessment & Plan:    1. Nephrolithiasis Extremely small punctate left kidney stone likely unrelated to her recent presentation.  The stone may in fact represent a vascular calcification or possibly even a small Randall's plaque given its very minute size.  Overall, this is clinically insignificant and likely an incidental finding.  Based on her symptoms, feel that this finding is unrelated to her presentation and symptoms.  Additionally, she was concerned about trace blood in her urine.  I explained  that she has no significant RBCs on her urinalysis today.  Urine dip often has false positives which is the case.  No need for hematuria evaluation at this time.   2. Left upper quadrant abdominal pain Based on the above as well as the nature of her pain which is very atypical for kidney stones, feel that she is some other underlying pathology.  She will follow back up with her PCP to explore this further.   3.  Urinary urgency Currently managed behaviorally.    We discussed pharmacotherapy today which she declined.  She will return if her symptoms worsen.   F/u prn  Hollice Espy, MD  [facility] [address] [place], [place] [address] [phone]

## 2019-09-09 ENCOUNTER — Ambulatory Visit
Admission: RE | Admit: 2019-09-09 | Discharge: 2019-09-09 | Disposition: A | Discharge status: Home / Self Care | Payer: BC Managed Care – PPO | Source: Ambulatory Visit | Attending: Pediatrics | Admitting: Pediatrics

## 2019-09-09 ENCOUNTER — Other Ambulatory Visit: Payer: Self-pay

## 2019-09-09 DIAGNOSIS — Z1231 Encounter for screening mammogram for malignant neoplasm of breast: Secondary | ICD-10-CM

## 2020-05-02 ENCOUNTER — Ambulatory Visit
Admission: EM | Admit: 2020-05-02 | Discharge: 2020-05-02 | Disposition: A | Discharge status: Home / Self Care | Payer: BC Managed Care – PPO | Attending: Family Medicine | Admitting: Family Medicine

## 2020-05-02 ENCOUNTER — Other Ambulatory Visit: Payer: Self-pay

## 2020-05-02 DIAGNOSIS — Z79899 Other long term (current) drug therapy: Secondary | ICD-10-CM | POA: Diagnosis not present

## 2020-05-02 DIAGNOSIS — F419 Anxiety disorder, unspecified: Secondary | ICD-10-CM | POA: Diagnosis not present

## 2020-05-02 DIAGNOSIS — Z20822 Contact with and (suspected) exposure to covid-19: Secondary | ICD-10-CM | POA: Diagnosis not present

## 2020-05-02 DIAGNOSIS — F411 Generalized anxiety disorder: Secondary | ICD-10-CM | POA: Diagnosis present

## 2020-05-02 DIAGNOSIS — J4521 Mild intermittent asthma with (acute) exacerbation: Secondary | ICD-10-CM | POA: Diagnosis present

## 2020-05-02 DIAGNOSIS — Z7951 Long term (current) use of inhaled steroids: Secondary | ICD-10-CM | POA: Diagnosis not present

## 2020-05-02 DIAGNOSIS — I493 Ventricular premature depolarization: Secondary | ICD-10-CM | POA: Insufficient documentation

## 2020-05-02 MED ORDER — FLUTICASONE-SALMETEROL 250-50 MCG/DOSE IN AEPB: 1.0000 | INHALATION_SPRAY | Freq: Two times a day (BID) | RESPIRATORY_TRACT | 0 refills | Status: AC | PRN

## 2020-05-02 MED ORDER — ALBUTEROL SULFATE HFA 108 (90 BASE) MCG/ACT IN AERS: 2.0000 | INHALATION_SPRAY | RESPIRATORY_TRACT | 0 refills | Status: AC | PRN

## 2020-05-02 MED ORDER — ALBUTEROL SULFATE (2.5 MG/3ML) 0.083% IN NEBU: 2.5000 mg | INHALATION_SOLUTION | Freq: Once | RESPIRATORY_TRACT | Status: DC

## 2020-05-02 NOTE — ED Provider Notes (Signed)
[facility]    CSN: [id number] Arrival date & time: 05/02/20  1430      History   Chief Complaint Chief Complaint  Patient presents with  . Shortness of Breath    HPI Amber Weaver is a 54 y.o. female.   HPI  Patient 12-1 PM today she began feeling short of breath then had chest tightness. Patient with hx of asthma. Used old 2017 inhalers around noon and then 1 pm without relief.  Patient states her asthma attacks are normally relieved by inhalers. Patient's reports chest tightness and pressure at the bottom of the throat.  Requests refills on her inhalers.   Endorses congestion and anxiety. Denies current chest pain, sore throat, fever, chills, changes of taste smell. Patient is fully vaccinated against COVID. Reports she mostly stays at home. No known sick contacts.    Past Medical History:  Diagnosis Date  . Anxiety   . Asthma   . Headache    migraines  . PONV (postoperative nausea and vomiting) 10/2017   one time N/V after d/c home  . PVC (premature ventricular contraction)     Patient Active Problem List   Diagnosis Date Noted  . Dysfunctional uterine bleeding 07/06/2019  . Migraine without aura 07/06/2019  . PMB (postmenopausal bleeding) 07/06/2019  . 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 Palpitations 06/03/2017  . Seasonal allergic rhinitis 09/27/2016  . Anxiety about health 12/27/2015  . Asthma, well controlled 12/27/2015    Past Surgical History:  Procedure Laterality Date  . ABDOMINAL HYSTERECTOMY    . CYSTOSCOPY N/A 08/07/2018   Procedure: CYSTOSCOPY;  Surgeon: Benjaman Kindler, MD;  Location: [facility] ORS;  Service: Gynecology;  Laterality: N/A;  . HYSTEROSCOPY WITH D & C N/A 11/07/2017   Procedure: DILATATION AND CURETTAGE /HYSTEROSCOPY;  Surgeon: Benjaman Kindler, MD;  Location: [facility] ORS;  Service: Gynecology;  Laterality: N/A;  . LAPAROSCOPIC BILATERAL SALPINGECTOMY Bilateral 08/07/2018   Procedure: LAPAROSCOPIC BILATERAL SALPINGECTOMY;  Surgeon:  Benjaman Kindler, MD;  Location: [facility] ORS;  Service: Gynecology;  Laterality: Bilateral;  . LAPAROSCOPIC HYSTERECTOMY N/A 08/07/2018   Procedure: HYSTERECTOMY TOTAL LAPAROSCOPIC;  Surgeon: Benjaman Kindler, MD;  Location: [facility] ORS;  Service: Gynecology;  Laterality: N/A;    OB History   No obstetric history on file.      Home Medications    Prior to Admission medications   Medication Sig Start Date End Date Taking? Authorizing Provider  cholecalciferol (VITAMIN D3) 25 MCG (1000 UT) tablet Take 1,000 Units by mouth daily.   Yes [provider]  albuterol (VENTOLIN HFA) 108 (90 Base) MCG/ACT inhaler Inhale 2 puffs into the lungs every 4 (four) hours as needed for wheezing or shortness of breath. 05/02/20   Eulalie Speights, Ronnette Juniper, DO  Fluticasone-Salmeterol (ADVAIR) 250-50 MCG/DOSE AEPB Inhale 1 puff into the lungs 2 (two) times daily as needed. 05/02/20   Lyndee Hensen, DO    Family History Family History  Problem Relation Age of Onset  . Hypertension Mother   . Stroke Father   . Breast cancer Neg Hx     Social History Social History   Tobacco Use  . Smoking status: Never Smoker  . Smokeless tobacco: Never Used  Vaping Use  . Vaping Use: Never used  Substance Use Topics  . Alcohol use: Yes    Alcohol/week: 1.0 standard drink    Types: 1 Glasses of wine per week  . Drug use: No     Allergies   Penicillins, Vicodin [hydrocodone-acetaminophen], and Morphine and related  Review of Systems Review of Systems: See HPI    Physical Exam Triage Vital Signs ED Triage Vitals  Enc Vitals Group     BP 05/02/20 1539 135/73     Pulse Rate 05/02/20 1539 89     Resp 05/02/20 1539 18     Temp 05/02/20 1539 98.3 F (36.8 C)     Temp Source 05/02/20 1539 Oral     SpO2 05/02/20 1539 99 %     Weight 05/02/20 1536 135 lb (61.2 kg)     Height 05/02/20 1536 5\' 6"  (1.676 m)     Head Circumference --      Peak Flow --      Pain Score 05/02/20 1536 2     Pain Loc --      Pain  Edu? --      Excl. in [place]? --    No data found.  Updated Vital Signs BP 135/73 (BP Location: Left Arm)   Pulse 89   Temp 98.3 F (36.8 C) (Oral)   Resp 18   Ht 5\' 6"  (1.676 m)   Wt 135 lb (61.2 kg)   LMP 03/02/2016 Comment: had hysterectomy 07/2018  SpO2 99%   BMI 21.79 kg/m   Visual Acuity Right Eye Distance:   Left Eye Distance:   Bilateral Distance:    Right Eye Near:   Left Eye Near:    Bilateral Near:     Physical Exam Vitals and nursing note reviewed.  Constitutional:      General: She is not in acute distress.    Appearance: She is well-developed. She is not ill-appearing.  HENT:     Head: Normocephalic and atraumatic.     Mouth/Throat:     Mouth: Mucous membranes are moist.     Pharynx: Oropharynx is clear. No pharyngeal swelling or oropharyngeal exudate.  Eyes:     Conjunctiva/sclera: Conjunctivae normal.  Cardiovascular:     Rate and Rhythm: Normal rate and regular rhythm.     Heart sounds: No murmur heard.   Pulmonary:     Effort: Pulmonary effort is normal. No respiratory distress.     Breath sounds: Examination of the right-lower field reveals wheezing. Examination of the left-lower field reveals wheezing. Wheezing (mild intermittent) present. No decreased breath sounds, rhonchi or rales.  Chest:     Chest wall: No tenderness.  Musculoskeletal:        General: Normal range of motion.     Cervical back: Normal range of motion and neck supple.  Lymphadenopathy:     Cervical: No cervical adenopathy.  Skin:    General: Skin is warm and dry.     Capillary Refill: Capillary refill takes less than 2 seconds.  Neurological:     General: No focal deficit present.     Mental Status: She is alert and oriented to person, place, and time.  Psychiatric:        Mood and Affect: Mood is anxious.      UC Treatments / Results  Labs (all labs ordered are listed, but only abnormal results are displayed) Labs Reviewed - No data to  display  EKG   Radiology No results found.  Procedures Procedures (including critical care time)  Medications Ordered in UC Medications  albuterol (PROVENTIL) (2.5 MG/3ML) 0.083% nebulizer solution 2.5 mg (has no administration in time range)    Initial Impression / Assessment and Plan / UC Course  I have reviewed the triage vital signs and the nursing notes.  Pertinent labs & imaging results that were available during my care of the patient were reviewed by me and considered in my medical decision making (see chart for details).     Asthma exacerbation  54 yo female with hx of asthma and anxiety about health p/w chest tightness and mild intermittent wheezing. EKG without acute changes or signs concerning for AMI.  Suspect there may be a component of anxiety at play here as well.  Inhaler rx's sent to pharmacy.  COVID test pending. Patient to follow up with PCP as needed.  Patient agrees with plan.  Final Clinical Impressions(s) / UC Diagnoses   Final diagnoses:  None   Discharge Instructions   None    ED Prescriptions    Medication Sig Dispense Auth. Provider   albuterol (VENTOLIN HFA) 108 (90 Base) MCG/ACT inhaler Inhale 2 puffs into the lungs every 4 (four) hours as needed for wheezing or shortness of breath. 1 each Verdie Barrows, DO   Fluticasone-Salmeterol (ADVAIR) 250-50 MCG/DOSE AEPB Inhale 1 puff into the lungs 2 (two) times daily as needed. 62 each Lyndee Hensen, DO     PDMP not reviewed this encounter.   Lyndee Hensen, DO 05/02/20 1655

## 2020-05-02 NOTE — ED Triage Notes (Signed)
Patient complains of shortness of breath and tightness in chest. Patient states that she is asthmatic. States that this occurred after vaccuming today. States that she did not get any relief from any of her inhalers today. Reports that her inhalers are from 2017, reports that she did have some fatigue yesterday.

## 2020-05-02 NOTE — Discharge Instructions (Addendum)
You were seen at the Urgent Care for shortness of breath and chest tightness. Please pick up your prescriptions at your pharmacy. Be sure to use inhalers as directed. Follow up with your PCP as needed.   If you haven't already, sign up for My Chart to have easy access to your labs results, and communication with your primary care physician.  Dr. Susa Simmonds

## 2020-05-03 LAB — SARS CORONAVIRUS 2 (TAT 6-24 HRS): SARS Coronavirus 2: NEGATIVE

## 2020-09-07 ENCOUNTER — Other Ambulatory Visit: Payer: Self-pay | Admitting: Pediatrics

## 2020-09-07 DIAGNOSIS — Z1231 Encounter for screening mammogram for malignant neoplasm of breast: Secondary | ICD-10-CM

## 2020-09-25 ENCOUNTER — Other Ambulatory Visit: Payer: Self-pay

## 2020-09-25 ENCOUNTER — Ambulatory Visit
Admission: RE | Admit: 2020-09-25 | Discharge: 2020-09-25 | Disposition: A | Discharge status: Home / Self Care | Payer: BC Managed Care – PPO | Source: Ambulatory Visit | Attending: Pediatrics | Admitting: Pediatrics

## 2020-09-25 DIAGNOSIS — Z1231 Encounter for screening mammogram for malignant neoplasm of breast: Secondary | ICD-10-CM | POA: Diagnosis not present

## 2021-05-17 ENCOUNTER — Emergency Department
Admission: EM | Admit: 2021-05-17 | Discharge: 2021-05-17 | Disposition: A | Discharge status: Home / Self Care | Payer: BC Managed Care – PPO | Attending: Emergency Medicine | Admitting: Emergency Medicine

## 2021-05-17 ENCOUNTER — Other Ambulatory Visit: Payer: Self-pay

## 2021-05-17 DIAGNOSIS — R002 Palpitations: Secondary | ICD-10-CM | POA: Insufficient documentation

## 2021-05-17 DIAGNOSIS — Z5321 Procedure and treatment not carried out due to patient leaving prior to being seen by health care provider: Secondary | ICD-10-CM | POA: Insufficient documentation

## 2021-05-17 DIAGNOSIS — R000 Tachycardia, unspecified: Secondary | ICD-10-CM | POA: Insufficient documentation

## 2021-05-17 DIAGNOSIS — I10 Essential (primary) hypertension: Secondary | ICD-10-CM | POA: Diagnosis not present

## 2021-05-17 LAB — COMPREHENSIVE METABOLIC PANEL
ALT: 33 U/L (ref 0–44)
AST: 27 U/L (ref 15–41)
Albumin: 4.1 g/dL (ref 3.5–5.0)
Alkaline Phosphatase: 54 U/L (ref 38–126)
Anion gap: 7 (ref 5–15)
BUN: 16 mg/dL (ref 6–20)
CO2: 27 mmol/L (ref 22–32)
Calcium: 9.2 mg/dL (ref 8.9–10.3)
Chloride: 107 mmol/L (ref 98–111)
Creatinine, Ser: 0.65 mg/dL (ref 0.44–1.00)
GFR, Estimated: >60 mL/min (ref >60)
Glucose, Bld: 130 mg/dL — ABNORMAL HIGH (ref 70–99)
Potassium: 3.6 mmol/L (ref 3.5–5.1)
Sodium: 141 mmol/L (ref 135–145)
Total Bilirubin: 0.5 mg/dL (ref 0.3–1.2)
Total Protein: 7.4 g/dL (ref 6.5–8.1)

## 2021-05-17 LAB — CBC
HCT: 38.1 % (ref 36.0–46.0)
Hemoglobin: 13 g/dL (ref 12.0–15.0)
MCH: 32.1 pg (ref 26.0–34.0)
MCHC: 34.1 g/dL (ref 30.0–36.0)
MCV: 94.1 fL (ref 80.0–100.0)
Platelets: 222 10*3/uL (ref 150–400)
RBC: 4.05 MIL/uL (ref 3.87–5.11)
RDW: 13.5 % (ref 11.5–15.5)
WBC: 7.4 10*3/uL (ref 4.0–10.5)
nRBC: 0 % (ref 0.0–0.2)

## 2021-05-17 LAB — TROPONIN I (HIGH SENSITIVITY)
Troponin I (High Sensitivity): 5 ng/L (ref <18)
Troponin I (High Sensitivity): 8 ng/L (ref <18)

## 2021-05-17 NOTE — ED Notes (Signed)
20GA SL to left a/c removed at pt's request; cannula intact, dressing applied

## 2021-05-17 NOTE — ED Notes (Signed)
First nurse-pt brought in via ems from home with sinus tach.  20g lac per ems.  No chest pain or sob.  Pt alert.

## 2021-05-17 NOTE — ED Provider Notes (Signed)
Emergency Medicine Provider Triage Evaluation Note  Amber Weaver , a 55 y.o. female  was evaluated in triage.  Pt complains of tachycardia and palpitations.  Patient has a history of same, has been diagnosed with PVCs in the past.  Patient states that today she started a steroid for an issue with her hand.  She had surgery on her left hand has had some ongoing issues.  She was placed on steroids starting today and is developed some tachycardia and palpitations.  No chest pain.  No shortness of breath.  No recent illnesses..  Review of Systems  Positive: Palpitations/tachycardia Negative: No chest pain, shortness of breath.  No fevers or chills, URI symptoms, abdominal complaints.  Physical Exam  BP (!) 154/79 (BP Location: Right Arm)   Pulse (!) 101   Temp 98 F (36.7 C) (Oral)   Resp 18   Ht 5\' 6"  (1.676 m)   Wt 70.3 kg   LMP 03/02/2016 Comment: had hysterectomy 07/2018  SpO2 99%   BMI 25.02 kg/m  Gen:   Awake, no distress   Resp:  Normal effort  MSK:   Moves extremities without difficulty  Other:  Slight tachycardia on auscultation of the heart but no murmurs, rubs, gallops.  Medical Decision Making  Medically screening exam initiated at 7:27 PM.  Appropriate orders placed.  Amber Weaver was informed that the remainder of the evaluation will be completed by another provider, this initial triage assessment does not replace that evaluation, and the importance of remaining in the ED until their evaluation is complete.  Patient will have basic labs, chest x-ray and EKG for evaluation.   Brynda Peon 05/17/21 Lynne Leader, MD 05/18/21 [phone]

## 2021-05-17 NOTE — ED Triage Notes (Addendum)
Pt in with co tachycardia since this am, states as high as 120's. Hx of the same and dx with PVC's. States also co htn, denies any other symptoms. Pt is currently on steroids for a hand problem.

## 2023-11-13 ENCOUNTER — Other Ambulatory Visit: Payer: Self-pay | Admitting: Pediatrics

## 2023-11-13 DIAGNOSIS — Z1231 Encounter for screening mammogram for malignant neoplasm of breast: Secondary | ICD-10-CM

## 2023-11-25 ENCOUNTER — Ambulatory Visit
Admission: RE | Admit: 2023-11-25 | Discharge: 2023-11-25 | Disposition: A | Discharge status: Home / Self Care | Source: Ambulatory Visit | Attending: Pediatrics | Admitting: Pediatrics

## 2023-11-25 DIAGNOSIS — Z1231 Encounter for screening mammogram for malignant neoplasm of breast: Secondary | ICD-10-CM | POA: Insufficient documentation

## 2024-01-31 ENCOUNTER — Ambulatory Visit: Admission: EM | Admit: 2024-01-31 | Discharge: 2024-01-31 | Disposition: A | Discharge status: Home / Self Care

## 2024-01-31 DIAGNOSIS — T63441A Toxic effect of venom of bees, accidental (unintentional), initial encounter: Secondary | ICD-10-CM

## 2024-01-31 MED ORDER — TRIAMCINOLONE ACETONIDE 0.5 % EX OINT: 1.0000 | TOPICAL_OINTMENT | Freq: Two times a day (BID) | CUTANEOUS | 0 refills | Status: AC

## 2024-01-31 MED ORDER — DOXYCYCLINE HYCLATE 100 MG PO CAPS: 100.0000 mg | ORAL_CAPSULE | Freq: Two times a day (BID) | ORAL | 0 refills | Status: AC

## 2024-01-31 MED ORDER — CETIRIZINE HCL 10 MG PO TABS
10.0000 mg | ORAL_TABLET | Freq: Every day | ORAL | 0 refills | Status: AC
Start: 2024-01-31 — End: ?

## 2024-01-31 NOTE — ED Provider Notes (Signed)
 [facility]  Note:  This document was prepared using Conservation officer, historic buildings and may include unintentional dictation errors.  MRN: [id number] DOB: 06-Oct-1965  Subjective:   Amber Weaver is a 58 y.o. female presenting for swelling and redness following a bee sting that occurred 4 to 5 days ago.  Patient reports she was stung on the left foot on Wednesday since that time she has been using hydrocortisone cream, taking diphenhydramine, and topical anti-itch spray with minimal improvement.  Patient is concerned for the increase in redness and swelling as well as blistering over the dorsum of her foot.  Patient denies any severe pain, spreading of erythema or swelling up her leg.  Patient denies any previous anaphylactic response to insect bites or stings.  No swelling to the lips, tongue, face, no difficulty swallowing or breathing, no stridor, no wheezing.  No current facility-administered medications for this encounter.  Current Outpatient Medications:    albuterol  (VENTOLIN  HFA) 108 (90 Base) MCG/ACT inhaler, Inhale 2 puffs into the lungs every 4 (four) hours as needed for wheezing or shortness of breath., Disp: 1 each, Rfl: 0   cetirizine (ZYRTEC) 10 MG tablet, Take 1 tablet (10 mg total) by mouth daily., Disp: 60 tablet, Rfl: 0   cholecalciferol (VITAMIN D3) 25 MCG (1000 UT) tablet, Take 1,000 Units by mouth daily., Disp: , Rfl:    doxycycline (VIBRAMYCIN) 100 MG capsule, Take 1 capsule (100 mg total) by mouth 2 (two) times daily for 5 days., Disp: 10 capsule, Rfl: 0   Fluticasone -Salmeterol (ADVAIR) 250-50 MCG/DOSE AEPB, Inhale 1 puff into the lungs 2 (two) times daily as needed., Disp: 60 each, Rfl: 0   triamcinolone ointment (KENALOG) 0.5 %, Apply 1 Application topically 2 (two) times daily., Disp: 30 g, Rfl: 0   Allergies  Allergen Reactions   Penicillins Other (See Comments)    Since childhood Has patient had a PCN reaction causing immediate rash,  facial/tongue/throat swelling, SOB or lightheadedness with hypotension: no Has patient had a PCN reaction causing severe rash involving mucus membranes or skin necrosis: no Has patient had a PCN reaction that required hospitalization: no Has patient had a PCN reaction occurring within the last 10 years: no If all of the above answers are "NO", then may proceed with Cephalosporin use.     Vicodin [Hydrocodone-Acetaminophen ] Nausea And Vomiting   Hydromorphone     Morphine And Codeine Other (See Comments)    Weird feeling     Past Medical History:  Diagnosis Date   Anxiety    Asthma    Headache    migraines   PONV (postoperative nausea and vomiting) 10/2017   one time N/V after d/c home   PVC (premature ventricular contraction)      Past Surgical History:  Procedure Laterality Date   ABDOMINAL HYSTERECTOMY     CYSTOSCOPY N/A 08/07/2018   Procedure: CYSTOSCOPY;  Surgeon: Prescilla Brod, MD;  Location: [facility];  Service: Gynecology;  Laterality: N/A;   HYSTEROSCOPY WITH D & C N/A 11/07/2017   Procedure: DILATATION AND CURETTAGE /HYSTEROSCOPY;  Surgeon: Prescilla Brod, MD;  Location: [facility];  Service: Gynecology;  Laterality: N/A;   LAPAROSCOPIC BILATERAL SALPINGECTOMY Bilateral 08/07/2018   Procedure: LAPAROSCOPIC BILATERAL SALPINGECTOMY;  Surgeon: Prescilla Brod, MD;  Location: [facility];  Service: Gynecology;  Laterality: Bilateral;   LAPAROSCOPIC HYSTERECTOMY N/A 08/07/2018   Procedure: HYSTERECTOMY TOTAL LAPAROSCOPIC;  Surgeon: Prescilla Brod, MD;  Location: [facility];  Service: Gynecology;  Laterality: N/A;    Family History  Problem Relation Age of Onset   Hypertension Mother    Stroke Father    Breast cancer Neg Hx     Social History   Tobacco Use   Smoking status: Never   Smokeless tobacco: Never  Vaping Use   Vaping status: Never Used  Substance Use Topics   Alcohol use: Yes    Alcohol/week: 1.0 standard drink of alcohol    Types: 1 Glasses of wine  per week   Drug use: No    ROS Refer to HPI for ROS details.  Objective:   Vitals: BP (!) 158/88 (BP Location: Left Arm)   Pulse 88   Temp 99 F (37.2 C) (Oral)   Ht 5\' 6"  (1.676 m)   Wt 150 lb (68 kg)   LMP 03/02/2016 Comment: had hysterectomy 07/2018  SpO2 100%   BMI 24.21 kg/m   Physical Exam Vitals and nursing note reviewed.  Constitutional:      General: She is not in acute distress.    Appearance: She is well-developed. She is not ill-appearing or toxic-appearing.  HENT:     Head: Normocephalic and atraumatic.     Nose: Nose normal.     Mouth/Throat:     Mouth: Mucous membranes are moist.     Pharynx: Oropharynx is clear.  Eyes:     General:        Right eye: No discharge.        Left eye: No discharge.     Extraocular Movements: Extraocular movements intact.     Conjunctiva/sclera: Conjunctivae normal.  Cardiovascular:     Rate and Rhythm: Normal rate.  Pulmonary:     Effort: Pulmonary effort is normal. No respiratory distress.  Skin:    General: Skin is warm and dry.     Findings: Erythema present.  Neurological:     General: No focal deficit present.     Mental Status: She is alert and oriented to person, place, and time.  Psychiatric:        Mood and Affect: Mood normal.        Behavior: Behavior normal.     Procedures  No results found for this or any previous visit (from the past 24 hours).  Assessment and Plan :     Discharge Instructions       1. Bee sting reaction, accidental or unintentional, initial encounter (Primary) - triamcinolone ointment (KENALOG) 0.5 %; Apply 1 Application topically 2 (two) times daily.  Dispense: 30 g; Refill: 0 - cetirizine (ZYRTEC) 10 MG tablet; Take 1 tablet (10 mg total) by mouth daily.  Dispense: 60 tablet; Refill: 0 - doxycycline (VIBRAMYCIN) 100 MG capsule; Take 1 capsule (100 mg total) by mouth 2 (two) times daily for 5 days.  Do not start antibiotic therapy unless symptoms worsen or you do not have  any improvement to symptoms after 3 days. -Continue to monitor symptoms for any change in severity if there is any escalation of current symptoms or development of new symptoms follow-up in urgent care or ER for further evaluation and management.    Ova Gillentine B Joyelle Siedlecki   Alexey Rhoads, [place] [address], [place] 01/31/24 [phone]

## 2024-01-31 NOTE — ED Triage Notes (Signed)
 Pt c/o bee sting to the left foot on  01/28/24  Pt states that she was unloading groceries when she felt a sharp pain in her foot.  Pt states that a bee crawled onto her sandal and stung her  Pt has been using Benadryl for symptoms  Pt states that it is tender and laying in bed bothers her.  Pt is now having blisters and swelling along her foot

## 2024-01-31 NOTE — Discharge Instructions (Addendum)
  1. Bee sting reaction, accidental or unintentional, initial encounter (Primary) - triamcinolone ointment (KENALOG) 0.5 %; Apply 1 Application topically 2 (two) times daily.  Dispense: 30 g; Refill: 0 - cetirizine (ZYRTEC) 10 MG tablet; Take 1 tablet (10 mg total) by mouth daily.  Dispense: 60 tablet; Refill: 0 - doxycycline (VIBRAMYCIN) 100 MG capsule; Take 1 capsule (100 mg total) by mouth 2 (two) times daily for 5 days.  Do not start antibiotic therapy unless symptoms worsen or you do not have any improvement to symptoms after 3 days. -Continue to monitor symptoms for any change in severity if there is any escalation of current symptoms or development of new symptoms follow-up in urgent care or ER for further evaluation and management.
